# Patient Record
Sex: Female | Born: 1975
Health system: Southern US, Community
[De-identification: ages and names within clinical notes are randomized; demographics above are authoritative.]

## PROBLEM LIST (undated history)

## (undated) DIAGNOSIS — F419 Anxiety disorder, unspecified: Secondary | ICD-10-CM

## (undated) DIAGNOSIS — I951 Orthostatic hypotension: Secondary | ICD-10-CM

## (undated) DIAGNOSIS — Z91018 Allergy to other foods: Secondary | ICD-10-CM

## (undated) DIAGNOSIS — K859 Acute pancreatitis without necrosis or infection, unspecified: Secondary | ICD-10-CM

## (undated) DIAGNOSIS — Z8616 Personal history of COVID-19: Secondary | ICD-10-CM

## (undated) DIAGNOSIS — J189 Pneumonia, unspecified organism: Secondary | ICD-10-CM

## (undated) DIAGNOSIS — F32A Depression, unspecified: Secondary | ICD-10-CM

## (undated) DIAGNOSIS — G43109 Migraine with aura, not intractable, without status migrainosus: Secondary | ICD-10-CM

## (undated) DIAGNOSIS — Z87442 Personal history of urinary calculi: Secondary | ICD-10-CM

## (undated) DIAGNOSIS — L509 Urticaria, unspecified: Secondary | ICD-10-CM

## (undated) DIAGNOSIS — N711 Chronic inflammatory disease of uterus: Secondary | ICD-10-CM

## (undated) DIAGNOSIS — R55 Syncope and collapse: Secondary | ICD-10-CM

## (undated) DIAGNOSIS — M26609 Unspecified temporomandibular joint disorder, unspecified side: Secondary | ICD-10-CM

## (undated) HISTORY — PX: PARTIAL NEPHRECTOMY: SHX414

## (undated) HISTORY — PX: URETERAL STENT PLACEMENT: SHX822

## (undated) HISTORY — DX: Depression, unspecified: F32.A

## (undated) HISTORY — DX: Anxiety disorder, unspecified: F41.9

## (undated) HISTORY — DX: Migraine with aura, not intractable, without status migrainosus: G43.109

## (undated) HISTORY — DX: Chronic inflammatory disease of uterus: N71.1

## (undated) HISTORY — DX: Allergy to other foods: Z91.018

## (undated) HISTORY — DX: Urticaria, unspecified: L50.9

## (undated) HISTORY — DX: Unspecified temporomandibular joint disorder, unspecified side: M26.609

## (undated) HISTORY — PX: TUBAL LIGATION: SHX77

## (undated) HISTORY — PX: DILATION AND CURETTAGE OF UTERUS: SHX78

---

## 1997-12-13 ENCOUNTER — Other Ambulatory Visit: Admission: RE | Admit: 1997-12-13 | Discharge: 1997-12-13 | Payer: Self-pay | Admitting: Gynecology

## 1997-12-15 ENCOUNTER — Ambulatory Visit (HOSPITAL_COMMUNITY): Admission: RE | Admit: 1997-12-15 | Discharge: 1997-12-15 | Payer: Self-pay | Admitting: Gynecology

## 1998-04-05 ENCOUNTER — Other Ambulatory Visit: Admission: RE | Admit: 1998-04-05 | Discharge: 1998-04-05 | Payer: Self-pay | Admitting: Gynecology

## 1998-07-03 ENCOUNTER — Other Ambulatory Visit: Admission: RE | Admit: 1998-07-03 | Discharge: 1998-07-03 | Payer: Self-pay | Admitting: Gynecology

## 1998-09-26 ENCOUNTER — Other Ambulatory Visit: Admission: RE | Admit: 1998-09-26 | Discharge: 1998-09-26 | Payer: Self-pay | Admitting: Gynecology

## 1998-12-26 ENCOUNTER — Other Ambulatory Visit: Admission: RE | Admit: 1998-12-26 | Discharge: 1998-12-26 | Payer: Self-pay | Admitting: Gynecology

## 1999-08-14 ENCOUNTER — Other Ambulatory Visit: Admission: RE | Admit: 1999-08-14 | Discharge: 1999-08-14 | Payer: Self-pay | Admitting: Gynecology

## 1999-10-11 ENCOUNTER — Ambulatory Visit (HOSPITAL_COMMUNITY): Admission: RE | Admit: 1999-10-11 | Discharge: 1999-10-11 | Payer: Self-pay | Admitting: Gynecology

## 1999-10-11 ENCOUNTER — Encounter: Payer: Self-pay | Admitting: Gynecology

## 1999-10-25 ENCOUNTER — Encounter: Payer: Self-pay | Admitting: Gynecology

## 1999-10-25 ENCOUNTER — Ambulatory Visit (HOSPITAL_COMMUNITY): Admission: RE | Admit: 1999-10-25 | Discharge: 1999-10-25 | Payer: Self-pay | Admitting: Gynecology

## 2000-01-16 ENCOUNTER — Inpatient Hospital Stay (HOSPITAL_COMMUNITY): Admission: AD | Admit: 2000-01-16 | Discharge: 2000-01-18 | Payer: Self-pay | Admitting: Gynecology

## 2000-02-02 ENCOUNTER — Inpatient Hospital Stay (HOSPITAL_COMMUNITY): Admission: AD | Admit: 2000-02-02 | Discharge: 2000-02-02 | Payer: Self-pay | Admitting: Internal Medicine

## 2000-02-04 ENCOUNTER — Inpatient Hospital Stay (HOSPITAL_COMMUNITY): Admission: AD | Admit: 2000-02-04 | Discharge: 2000-02-07 | Payer: Self-pay | Admitting: Gynecology

## 2000-03-05 ENCOUNTER — Other Ambulatory Visit: Admission: RE | Admit: 2000-03-05 | Discharge: 2000-03-05 | Payer: Self-pay | Admitting: Internal Medicine

## 2000-03-10 ENCOUNTER — Encounter: Payer: Self-pay | Admitting: Gynecology

## 2000-03-10 ENCOUNTER — Ambulatory Visit (HOSPITAL_COMMUNITY): Admission: RE | Admit: 2000-03-10 | Discharge: 2000-03-10 | Payer: Self-pay | Admitting: Gynecology

## 2000-03-18 ENCOUNTER — Emergency Department (HOSPITAL_COMMUNITY): Admission: EM | Admit: 2000-03-18 | Discharge: 2000-03-18 | Payer: Self-pay | Admitting: Emergency Medicine

## 2000-04-06 ENCOUNTER — Encounter: Payer: Self-pay | Admitting: Urology

## 2000-04-06 ENCOUNTER — Ambulatory Visit (HOSPITAL_COMMUNITY): Admission: RE | Admit: 2000-04-06 | Discharge: 2000-04-06 | Payer: Self-pay | Admitting: Urology

## 2000-04-07 ENCOUNTER — Inpatient Hospital Stay (HOSPITAL_COMMUNITY): Admission: RE | Admit: 2000-04-07 | Discharge: 2000-04-10 | Payer: Self-pay | Admitting: Urology

## 2000-04-07 ENCOUNTER — Encounter: Payer: Self-pay | Admitting: Urology

## 2000-04-07 ENCOUNTER — Encounter (INDEPENDENT_AMBULATORY_CARE_PROVIDER_SITE_OTHER): Payer: Self-pay | Admitting: Specialist

## 2000-04-08 ENCOUNTER — Encounter: Payer: Self-pay | Admitting: Urology

## 2000-04-09 ENCOUNTER — Encounter: Payer: Self-pay | Admitting: Urology

## 2000-04-20 ENCOUNTER — Encounter: Admission: RE | Admit: 2000-04-20 | Discharge: 2000-04-20 | Payer: Self-pay | Admitting: Urology

## 2000-04-20 ENCOUNTER — Encounter: Payer: Self-pay | Admitting: Urology

## 2000-11-04 ENCOUNTER — Encounter: Admission: RE | Admit: 2000-11-04 | Discharge: 2000-11-04 | Payer: Self-pay | Admitting: Urology

## 2000-11-04 ENCOUNTER — Encounter: Payer: Self-pay | Admitting: Urology

## 2001-02-18 ENCOUNTER — Encounter: Payer: Self-pay | Admitting: Urology

## 2001-02-18 ENCOUNTER — Encounter: Admission: RE | Admit: 2001-02-18 | Discharge: 2001-02-18 | Payer: Self-pay | Admitting: Urology

## 2001-03-10 ENCOUNTER — Other Ambulatory Visit: Admission: RE | Admit: 2001-03-10 | Discharge: 2001-03-10 | Payer: Self-pay | Admitting: Gynecology

## 2001-11-04 ENCOUNTER — Other Ambulatory Visit: Admission: RE | Admit: 2001-11-04 | Discharge: 2001-11-04 | Payer: Self-pay | Admitting: Gynecology

## 2001-12-02 ENCOUNTER — Ambulatory Visit (HOSPITAL_COMMUNITY): Admission: RE | Admit: 2001-12-02 | Discharge: 2001-12-02 | Payer: Self-pay | Admitting: Gynecology

## 2001-12-02 ENCOUNTER — Encounter: Payer: Self-pay | Admitting: Gynecology

## 2002-04-18 ENCOUNTER — Inpatient Hospital Stay (HOSPITAL_COMMUNITY): Admission: AD | Admit: 2002-04-18 | Discharge: 2002-04-18 | Payer: Self-pay | Admitting: Gynecology

## 2002-04-21 ENCOUNTER — Observation Stay (HOSPITAL_COMMUNITY): Admission: AD | Admit: 2002-04-21 | Discharge: 2002-04-22 | Payer: Self-pay | Admitting: Gynecology

## 2002-05-03 ENCOUNTER — Inpatient Hospital Stay (HOSPITAL_COMMUNITY): Admission: AD | Admit: 2002-05-03 | Discharge: 2002-05-07 | Payer: Self-pay | Admitting: Gynecology

## 2002-05-03 ENCOUNTER — Encounter (INDEPENDENT_AMBULATORY_CARE_PROVIDER_SITE_OTHER): Payer: Self-pay | Admitting: *Deleted

## 2002-05-08 ENCOUNTER — Inpatient Hospital Stay (HOSPITAL_COMMUNITY): Admission: AD | Admit: 2002-05-08 | Discharge: 2002-05-08 | Payer: Self-pay | Admitting: Gynecology

## 2002-06-02 ENCOUNTER — Encounter: Admission: RE | Admit: 2002-06-02 | Discharge: 2002-06-02 | Payer: Self-pay | Admitting: Urology

## 2002-06-02 ENCOUNTER — Encounter: Payer: Self-pay | Admitting: Urology

## 2002-06-27 ENCOUNTER — Other Ambulatory Visit: Admission: RE | Admit: 2002-06-27 | Discharge: 2002-06-27 | Payer: Self-pay | Admitting: Gynecology

## 2003-01-24 ENCOUNTER — Encounter: Payer: Self-pay | Admitting: Emergency Medicine

## 2003-01-24 ENCOUNTER — Emergency Department (HOSPITAL_COMMUNITY): Admission: EM | Admit: 2003-01-24 | Discharge: 2003-01-24 | Payer: Self-pay | Admitting: Emergency Medicine

## 2005-09-08 ENCOUNTER — Ambulatory Visit: Payer: Self-pay | Admitting: Internal Medicine

## 2005-10-03 ENCOUNTER — Ambulatory Visit: Payer: Self-pay | Admitting: Internal Medicine

## 2007-12-07 ENCOUNTER — Ambulatory Visit (HOSPITAL_COMMUNITY): Admission: RE | Admit: 2007-12-07 | Discharge: 2007-12-07 | Payer: Self-pay | Admitting: Cardiovascular Disease

## 2008-02-17 ENCOUNTER — Emergency Department (HOSPITAL_COMMUNITY): Admission: EM | Admit: 2008-02-17 | Discharge: 2008-02-17 | Payer: Self-pay | Admitting: Emergency Medicine

## 2011-02-11 NOTE — Op Note (Signed)
[name], [name]             ACCOUNT NO.:  [id number]   MEDICAL RECORD NO.:  [id number]          PATIENT TYPE:  OIB   LOCATION:  2854                         FACILITY:  [facility]   PHYSICIAN:  Richard A. Alanda Amass, M.D.DATE OF BIRTH:  10/20/1975   DATE OF PROCEDURE:  12/07/2007  DATE OF DISCHARGE:                               OPERATIVE REPORT   TILT TABLE REPORT:  [name] is a 35 year old divorced white mother of three children, 14-  year-old girl and a 68 and 34 year old boys.  She is a nonsmoker and  works as a Lawyer at [employer].  She has a long history of  approximately 10 years of recurrent presyncope historically compatible  with neurocardiogenic syncope and autonomic dysfunction.  She has had a  negative endocrinology workup in the past.  She had been under the care  of Dr. Armanda Magic and subsequently under the continued care of Dr. Yates Decamp for this.  She had been on intermittent Proamatine and Florinef in  the past.  She has recurrent episodes of near-syncope and presyncope and  has had some very transient episodes of lost consciousness for  seconds.  She occasionally has palpitations but these are short-lived  and are usually not associated with her presyncopal episodes.  Her  episodes can happen at any time and are not specifically brought on by  stress.  She has not been tried on beta blockers or SSRIs in the past  and has self-regulated her midodrine historically.  Currently she is on  moderate dose, 5 mg t.i.d., and Florinef 0.1 mg at bedtime was fairly  good control of her symptoms.  She has had monitors in the past and  developed short, nonsustained runs of the SVT.  She had been seen by Dr.  Lewayne Bunting in the past, who noted that she had and some ectopic atrial  beats and some wandering atrial pacemaker.  He also felt that her rhythm  showed possible long RP atrial SVT that was nonsustained.  He and the  patient both felt that since these episodes  were infrequent, that she  was currently not a candidate for ablation.  Once again, she has not  been tried on calcium and beta blockers because she generally has a  relatively low blood pressure in the 95-100 range.  She was referred by  Dr. Jacinto Halim for tilt table test to rule out any significant  bradyarrhythmia.   The patient was brought to the second floor CP lab, placed supine on the  tilt table and stabilized prior to tilting.  She had been off her  midodrine and Proamatine for 48 hours.  She was normally hydrated IV-  wise lines pre test.   She was tilted upright at 70 degrees.  Throughout the 45-minute period  of tilt, blood pressure ranged from 105-110.  The lowest was  approximately 98 and there was minimal fluctuation.  For the first 35-40  minutes, heart rate range came up from 59 to the 90 range and then to  about 100.  She had a short episode of relative sinus tachycardia  lasting  for about 5 seconds that was asymptomatic at tilt of about 40  minutes, which may have represented a nonsustained run of her SVT.  She  then dropped down to 56-60 per minute.  This was asymptomatic and then  heart rate came back to the 80s.   She was tilted for a total of 45 minutes with no syncope, presyncope or  lightheadedness and no sustained arrhythmia and no significant  bradycardia.   In the post-tilt period she had some occasional sinus pauses of about 1  to 1-1/2 seconds that were asymptomatic.  There was no significant  bradyarrhythmia and no hypotension and no SVT.   The patient was not given Isuprel infusion because of her history of  atrial tachycardia in the past and the fact that she did reach heart  rates of 110-120 with normal tilting   Tilt table test essentially negative.  No significant symptoms.  No  significant bradycardia.  There was a short nonsustained run of probable  atrial tachycardia that was asymptomatic toward the end of the 45-minute  upright tilt and  occasional sinus pauses in the first 5 minutes of  recovery that was short, that there were 1 to 1-1/2 seconds and  asymptomatic.   The patient was reassured about these results.  She will be observed in  the outpatient area and we plan to discharge her later to follow up with  Dr. Jacinto Halim.      Richard A. Alanda Amass, M.D.  Electronically Signed     RAW/MEDQ  D:  12/07/2007  T:  12/08/2007  Job:  [id number]   cc:   [facility]

## 2011-02-14 NOTE — H&P (Signed)
[facility]  Patient:    [name], [name]                      MRN: [id number] Adm. Date:  [id number] Disc. Date: [id number] Attending:  Tonye Royalty                         History and Physical  CHIEF COMPLAINT:              Term intrauterine pregnancy.  Favorable cervix at 38+ weeks estimated gestational age.  Left upper quadrant anechoic cystic mass.  HISTORY OF PRESENT ILLNESS:   The patient is a 35 year old, gravida 3, para 1, abortus 1, whose corrected estimated date of confinement is May 22, and at the ime of admission for induction will be [redacted] weeks pregnant.  The patients prenatal course has been significant for the fact that:  1) Her dates were not consistent with her size and an ultrasound had readjusted her due date to May 22.  The patient during one of her visits at 16 weeks had pointed out that she was having "blackouts" two to three weeks prior to that and on cardiac auscultation, she was found to have a systolic ejection murmur grade 2 to 3/6 with a loud S2 sound, possible mitral valve prolapse and she was referred to Armanda Magic, M.D., cardiologist here in [place].  She had an echocardiogram which demonstrated hat she had normal left ventricular internal dimensions with low normal left ventricular function, moderate pulmonic regurgitation, mild tricuspid regurgitation, no pericardial effusion was reported.  No evidence of ASD or VSD. In comparison to a previous study, her left ventricular function appeared slightly better was the report.  Armanda Magic, M.D. had stated that her heart murmur had no significant valvular lesion and that dizziness was more than likely secondary to low blood pressure from pregnancy and mildly reduced left ventricular function nd the final recommendation was to follow up late in June after her delivery for a  repeat ultrasound of her heart to make sure that her left ventricular  function as not changed any.  No recommendations at the time of delivery or during labor was reported.  On January 8, the patient had presented to the office complaining of  right flank pain, abdominal discomfort which started during the evening as a stabbing sensation.  She had extensive workup to include an ultrasound of her gallbladder, kidney, and upper abdomen.  The ultrasound had shown no evidence of abruptio placenta, normal gallbladder, liver, and abdomen with the exception in  confirming an anechoic mass in the left upper quadrant measuring 8.2 cm.  The differential diagnosis was possible renal cyst or an obstructed duplicated system or posttraumatic hematoma with resolution to the cystic area.  Subsequently, the patient had an MRI done on January 26, which demonstrated once again an 8 cm cyst interposed between the spleen and the kidney on the left with displacement of the left kidney inferiorly and some displacement of the pancreatic tail superiorly nd anteriorly.  The report had also stated some of the images that it was possible  that this may be projecting from the upper pole region of the kidney, although,  leaking of the renal cortex was not identified.  A renal cyst was entertained and a possible seroma also.  They did not see a separate adrenal gland, but because of the cyst, there was distortion of the anatomy.  Nevertheless, there  was no mesenteric or retroperitoneal adenopathy or hydronephrosis of either kidney. Follow-up CT scan was recommended with contrast after delivery as the recommendation by Jamison Neighbor, M.D. for which a telephone consultation had een made during this evaluation.  The patient was continued to be followed through er prenatal visits with complaints of low abdominal pains and pelvic pressure. [facility] for evaluation and treatment of low back discomfort and stretching exercises to help  alleviate her symptoms until the time of her delivery.  She was hospitalized during her third trimester for several days and was found to be in preterm labor and was released home on Terbutaline oral tocolytics 2.5 mg p.o. q.4h.  Her GBS culture at the hospital n April 19, was negative.  Her Terbutaline was discontinued at 37 weeks and she was seen in the office this on May 7, and was found to be fingertip, 70 to 80% effaced, -3 station, and once again was complaining of lower abdominal pressure and had een seen in the hospital two days before where she thought she was in labor.  PAST MEDICAL HISTORY:         The patient is an unwed parent.  She had a spontaneous miscarriage in 1997.  In 1998 she had a term pregnancy at [redacted] weeks gestation that was 7 pounds 11 ounce female with no complications.  ALLERGIES:                    No known drug allergies.  FAMILY HISTORY:               Otherwise unremarkable.  The father of this child is unknown.  REVIEW OF SYSTEMS:            See [place] form.  PHYSICAL EXAMINATION:  GENERAL:                      A well-developed, well-nourished female.  HEENT:                        Unremarkable.  NECK:                         Supple.  Trachea midline.  No carotid bruits.  No  thyromegaly.  LUNGS:                        Clear to auscultation without rhonchis or wheezes.  HEART:                        Regular rate and rhythm with no murmurs or gallops.  BREASTS:                      Done during the first trimester and was reported o be normal.  ABDOMEN:                      Gravid uterus with a fundal height, measurement of 38 cm.  Vertex presentation by [facility] maneuver.  Positive fetal heart tones.  PELVIC:                       Cervix fingertip, 70 to 80% effaced, -3 station.  EXTREMITIES:  DTR 1+, negative clonus.  Trace edema.  PRENATAL LABORATORY DATA:     O negative blood type, negative antibody  screen.  Toxoplasmosis screen was negative. VDRL was negative.  Rubella titer with evidence of immunity.  Hepatitis B surface antigen and HIV were negative.  Toxicology screen was negative.  Pap smear was normal.  Alpha fetoprotein was normal and so was the  diabetes screen.  GBS culture done on April 19, was negative.  The patient did receive her RhoGAM at [redacted] weeks gestation on February 2.  ASSESSMENT:                   A 35 year old, gravida 3, para 1, abortus 1, at [redacted] weeks gestation with persistent complaints of lower abdominal pressure.  She has been in the hospital several times in early labor.  She has had a history of preterm labor with this pregnancy.  The patient has a left upper quadrant anechoic cystic mass measuring 8 cm, possible renal cyst which will need to be followed p with a CT with contrast after delivery.  During her postpartum period, perhaps ix weeks later.  The patient with negative Group B Strep culture on April 19 in the office.  The patient is being brought in due to the fact that her cervix appears to be favorable.  She is now 38 weeks and because of all of the discomfort and pain that she has had, we are admitting her for an induction.  Risks, benefits, pros, and cons of induction were discussed with the patient and her mother.  All questions were answered and will follow accordingly.  PLAN:                         The patient is to be admitted on the evening of May 8, for Cervidil placement for cervical ripening followed by Pitocin induction in the early morning of May 9.  All questions were answered and will follow accordingly. DD:  02/04/00 TD:  02/04/00 Job: [id number]

## 2011-02-14 NOTE — Discharge Summary (Signed)
NAME:  [name], [name]                         ACCOUNT NO.:  [id number]   MEDICAL RECORD NO.:  [id number]                   PATIENT TYPE:  MAT   LOCATION:  [facility]                                 FACILITY:  [facility]   PHYSICIAN:  Timothy P. Fontaine, M.D.           DATE OF BIRTH:  1976/08/17   DATE OF ADMISSION:  05/03/2002  DATE OF DISCHARGE:  05/07/2002                                 DISCHARGE SUMMARY   DISCHARGE DIAGNOSES:  Intrauterine pregnancy at 37+ weeks delivered,  postural hypotension, request for elective permanent sterilization,  prolapsed cord, status post emergency low uterine transverse cesarean  section, bilateral tubal sterilization procedure Pomeroy technique by Dr.  Reynaldo Minium on May 04, 2002, Rh negative.   HISTORY:  This is a 35 year old female gravida 4, para 2, aborta 1 with an  EDC of May 19, 2002.  The patient's prenatal course was complicated by  patient having postural hypotension and was placed on Florinef which she was  placed on 0.1 mg q.d.  Also had history of recurrent urinary tract  infections.  Had been on Macrobid q.d. and history of left upper pole renal  nephrectomy in 2001.  She also developed preterm labor.  Was on terbutaline  until 36 weeks this pregnancy.  Recommended by the cardiologist to place  patient on Solu-Medrol 30 mg IV q.12h. when she was in labor as well as  postpartum and eventually taper off with Deltasone patch one week upon  discharge.   HOSPITAL COURSE:  On May 03, 2002 patient is admitted at 37-1/2 weeks for  induction secondary to anxiety, apprehension, and history of postural  hypotension.  The patient did desire elective permanent sterilization.  The  patient was begun on Pitocin and was begun on Solu-Medrol 30 mg IV q.12h.  During labor.  On the morning of May 04, 2002 artificial rupture of  membranes was performed and prolapsed cord was evident.  The examiner was  able to place a cord back into the uterine  cavity.  The patient was placed  in Trendelenburg position and was rushed for emergency cesarean section.  On  May 04, 2002 patient underwent an emergency low uterine segment transverse  cesarean section, bilateral tubal sterilization procedure Pomeroy technique  by Dr. Reynaldo Minium and at [phone] underwent delivery of a female, Apgars of 8  and 10 weighing 7 pounds 4 ounces.  Postpartum patient remained afebrile,  voiding, and in stable condition.  She was discharged to home on May 07, 2002.  She had received RhoGAM prior to discharge.   ACCESSORY CLINICAL FINDINGS:  Laboratories:  The patient is O-.  Rubella  immune.  On May 05, 2002 hemoglobin 9.3, hematocrit 26.4.   DISPOSITION:  The patient was discharged to home.  Follow up in six weeks.  Have any problems prior to that time to be seen in the office.  Steroid  taper was given and she was  to call her cardiologist to follow up the  following Monday to see if they wanted to change her dose of the steroid  pack or continue to taper.  She was given a prescription for __________  20  p.r.n. pain.     Susa Loffler, P.A.                    Timothy P. Fontaine, M.D.    TSG/MEDQ  D:  06/03/2002  T:  06/03/2002  Job:  [id number]

## 2011-02-14 NOTE — Discharge Summary (Signed)
[facility]  Patient:    [name], [name]                      MRN: [id number] Adm. Date:  [id number] Disc. Date: 01/17/00 Attending:  Douglass Rivers Dictator:   Antony Contras, RNC, [facility]                           Discharge Summary  DISCHARGE DIAGNOSES:          1. Intrauterine pregnancy at 35-2/7 weeks.                               2. Preterm labor.  PROCEDURE:                    Tocolysis, magnesium sulfate drip.  HISTORY OF PRESENT ILLNESS:   The patient is a 35 year old, gravida 3, para 1, abortus 1, with a corrected EDC of Feb 23, 2000.  Prenatal course was complicated by the following risk factors.  A slight reduction of left ventricular function per echogram done secondary to a fainting spell at 16.5 weeks.  Right lower quadrant pain since January of 2001.  Incidental finding anechoic mass left upper quadrant 8.2 cm between the kidney and spleen, questionable renal cyst.  PRENATAL LABORATORY DATA:     Blood type O negative, antibody screen negative, rubella positive.  RPR, HBSAG, HIV negative.  MSAFP normal.  HOSPITAL COURSE:              The patient was admitted on January 16, 2000, with uterine contractions.  Cervix at that time was 1 cm, thick, vertex presentation. She was initially managed with Terbutaline 0.25 mg subcu q.20 minutes apart x 3  doses.  Her contractions continued and she did progress in dilatation to 1 cm, 0 to 100% effaced, -2 station.  Magnesium sulfate drip was initiated.  She responded to this management and the magnesium sulfate was able to be discontinued on January 17, 2000, and oral Terbutaline begun.  She continued to respond to this treatment with no further contractions or cervical changes and was able to be discharged n April 21, in stable condition.  DISPOSITION:                  Follow up in the office as indicated.  Continue on oral Terbutaline.  Notify office with any preterm labor  symptoms. DD:  02/07/00 TD:  02/07/00 Job: [id number]

## 2011-02-14 NOTE — Op Note (Signed)
[facility]  Patient:    [name], [name]                      MRN: [id number] Proc. Date: 04/07/00 Adm. Date:  [id number] Disc. Date: [id number] Attending:  Doug Sou CC:         Jamison Neighbor, M.D.             Juan H. Lily Peer, M.D.                           Operative Report  SERVICE:  Urology.  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  PROCEDURE:  Cystoscopy, left retrograde ureteral pyelography with ureteral catheterization, left partial nephrectomy.  SURGEON:  Jamison Neighbor, M.D.  ASSISTANT:  Lindaann Slough, M.D.  ANESTHESIA:  General.  COMPLICATIONS:  None.  DRAINS:  16 French Foley catheter.  BRIEF HISTORY:  This 35 year old female was found to have a cystic structure in the upper pole of the left kidney during a recent pregnancy. This was first seen on ultrasound and then was confirmed with CT. An ECG showed no evidence of reflux into the lower pole segment. It does appear to be complete duplication but the ureteral anatomy has not been well defined on studies. The patient has pain on the left hand side and would like to have this mass removed. She understands the risks and benefits of the procedure and gave full and informed consent. We noted that the patient had a normal urine in the office yesterday. She has been started on the antibiotics and will have antibiotics given preoperatively Ancef and gentamycin).  DESCRIPTION OF PROCEDURE:  After successful induction of general anesthesia, the patient was placed in the dorsal lithotomy position, prepped with Betadine and draped in the usual sterile fashion. Cystoscopy was performed and the bladder was carefully inspected. The mucosa was somewhat friable and reddened most likely from her cystitis. The left ureteral orifice was found out somewhat  laterally in a retrograde study done through the side showed a drooping lily sign with the lower pole segment pushed down from the mass up above the kidney. A ureteral calculus passed up into this area in case access was needed to the collecting system. It was thought that perhaps an incision would need to be made across the kidney to remove the upper pole segment and if so, it would be ideal to be able to place methylene blue up into the collecting system to make sure that it remained intact. The triangle was carefully searched and the ureteral orifice to the upper pole segment was never seen. The patient had a Foley catheter inserted. The ureteral catheter was attached to that with silk ties and they were placed in separate drainage bags. The patient was then from the dorsal lithotomy position, placed in a full flank position with a beanbag in place, the kidney rest raised and the table fully flexed. She had PAS in place. She was fully padded including the axillary roll. She was then secured with tape, prepped with Betadine and draped in the usual sterile fashion. The twelfth and the eleventh ribs were easily seen and an incision was made taking off the tip of the twelfth rib. This was carried down through the flank exposing the kidney. The pleura was seen but was not entered. Gerotas fascia  was opened and the kidney was seen. As the kidney was flipped forward, the right renal artery could be identified. The cyst was dissected away from the surrounding tissue and appeared to be very separate from the kidney itself. There was no parenchyma surrounding this upper pole segment. It was seen to have indented the remainder of the kidney. The cyst involved the adrenal gland and to remove the cyst the adrenal gland had to be taken. Clips were used to take the adrenal gland and cyst down with all connections being clipped to prevent bleeding. Some of these were quite close in the renal artery where  the mass was pushed up against it but the renal artery was not injured in any way. The mass was removed including the adrenal gland. Hemostasis was obtained with additional clips and electrocautery. Careful inspection showed that the kidney was otherwise normal but had been pushed down to an inferior position. The blood supply to the kidney appeared normal. The kidney was quite viable. The intra-abdominal contents were not injured in any way. Careful inspection of the pleura showed 1 possible pin hole which was oversewn. The patient will have a chest x-ray checked to make sure she has not developed any pneumothorax of any kind. The patient was taken out of flex. The entire area was irrigated with antibiotic solution. She was then closed in layers with #1 Vicryl and the final layer being a #1 PDS. The skin was closed with surgical clips after irrigation with antibiotic solution. The patient had a dressing applied. The ureteral catheter was removed. The Foley catheter was placed for straight drainage. The patient tolerated the procedure well and was taken to the recovery room in good condition. DD:  04/07/00 TD:  04/07/00 Job: [id number]

## 2011-02-14 NOTE — H&P (Signed)
NAME:  [name]                         ACCOUNT NO.:  [id number]   MEDICAL RECORD NO.:  [id number]                   PATIENT TYPE:  INP   LOCATION:  9175                                 FACILITY:  [facility]   PHYSICIAN:  Juan H. Lily Peer, M.D.             DATE OF BIRTH:  12/15/1975   DATE OF ADMISSION:  05/03/2002  DATE OF DISCHARGE:                                HISTORY & PHYSICAL   HISTORY OF PRESENT ILLNESS:  The patient is a 35 year old gravida 4, para 2,  AB1 currently at 37-1/2 weeks estimated gestational age with an estimated  date of confinement of May 19, 2002.  The patient was admitted to [facility] on July 25 for observation.  She was in early labor and was  released home.  Her cervix at that time had demonstrated to be fingertip in  dilatation, about 60%-70% effacement, and ballotable vertex presentation.  She has had much anxiety and apprehension with this pregnancy.  She has  suffered from postural hypertension and has had extensive evaluation by a  cardiologist.  No structural cardiac defect noted per se and was eventually  placed on Florinef, for which she was taking 0.1 mg p.o. q.d.  She has had a  history of the syncope with a borderline hypotension and this was the  recommendation for her to have been on the Florinef 0.1 mg q.d.  She also  has had recurrent urinary tract infections and for prophylaxis she had been  on Macrobid one p.o. q.d.  She has had a previous history of left upper pole  renal nephrectomy in 2001.  She had been on terbutaline up to approximately  [redacted] weeks gestation due to the premature contractions.  This is the reason  that she was admitted for induction due to her anxiety and apprehension and  due to the fact that her previous pregnancies were five and eight hours,  respectively.  Recommendation by the cardiologist was to place the patient  on Solu-Medrol 30 mg IV q.12h. when in labor, as well as postpartum, and to  eventually  taper off with a Deltasone pack over one week upon discharge from  the hospital.   PAST MEDICAL HISTORY:  She has had a left upper pole nephrectomy in 2001.  Preterm labor last pregnancy and this pregnancy.  She received RhoGAM at [redacted]  weeks gestation.  History of syncope and borderline hypotension and  recurrent urinary tract infections.  D&C in the past.   ALLERGIES:  She denies any medical allergy.   REVIEW OF SYSTEMS:  See [place] form.   PHYSICAL EXAMINATION:  GENERAL:  A well-developed, well-nourished female.  HEENT:  Unremarkable.  NECK:  Supple, trachea midline.  No carotid bruits, no thyromegaly.  LUNGS:  Clear to auscultation.  No rhonchi or wheezes.  HEART:  Regular rate and rhythm.  No murmurs or gallops.  BREASTS:  Exam not done.  ABDOMEN:  Soft, nontender without rebound or guarding.  PELVIC:  Fingertip, 60%-70% effaced, ballotable.  EXTREMITIES:  DTR 1+, negative clonus.  Trace edema.   PRENATAL LABORATORY DATA:  Blood type is O- and negative antibody screen.  VDRL, hepatitis B surface antigen, HIV were negative.  Rubella titer with  evidence of immunity.  Pap smear was normal.  Alpha-fetoprotein was normal.  Diabetes screen was normal.  GBS culture was negative.   ASSESSMENT:  A 34 year old gravida 4, para 2, AB1 at 37-1/2 weeks estimated  gestational age with anxiety and apprehension during this pregnancy.  She  has had episodes of false labor.  The patient with history of postural  hypotension and has been on Florinef 0.1 mg q.d. and was admitted for  induction.  Her cervix was fingertip, 60%-70% effaced, -2 station,  ballotable presentation.  A reassuring fetal heart rate tracing on  admission.  She will be started on Pitocin and possibly, if the cervix does  not change after 8 or 10 hour period, we will need to discontinue the  Pitocin and start Cervidil for cervical ripening, if the fetal heart rate  tracing and vital signs continue to be reassuring.  The  patient with  negative group B Strep culture.  Game plan also is to start Solu-Medrol 30  mg IV q.12h. during labor and postpartum and taper off with a Deltasone pack  over one week upon discharge from the hospital.  All of the above were  discussed with the patient.  All questions were answered and we will follow  accordingly.   PLAN:  As per assessment above.                                               Juan H. Lily Peer, M.D.    JHF/MEDQ  D:  05/03/2002  T:  05/03/2002  Job:  [id number]

## 2011-02-14 NOTE — Discharge Summary (Signed)
[facility]  Patient:    [name], [name]                      MRN: [id number] Adm. Date:  [id number] Disc. Date: [id number] Attending:  Londell Moh CC:         Jamison Neighbor, M.D.             Juan H. Lily Peer, M.D.                           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  DISCHARGE DIAGNOSES: 1. 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 2. Postoperative pneumothorax.  PRINCIPAL PROCEDURE:  Cystoscopy left retrograde and partial nephrectomy done on the date of admission.  HISTORY:  This 35 year old female underwent evaluation for a possible infection during her pregnancy.  Ultrasound showed a cystic mass in the upper pole felt to be consistent with duplication and degeneration of the upper pole segment.  CT scan confirmed the presence of this mass.  ECG showed no evidence of reflux.  The patient was quite bothered by this large mass and would like to have it removed.  She is being admitted following her partial nephrectomy.  PAST MEDICAL HISTORY:  Unremarkable.  She does have a history of heart murmur. A recent 2-D echocardiogram was done which showed there were no abnormalities noted.  The patient is still breast feeding.  MEDICATIONS:  She takes no medications other than oral contraceptives and prenatal vitamin.  HOSPITAL COURSE:  The patient was taken to the operating room where she underwent cystoscopy left retrograde and partial nephrectomy.  Her postoperative course was unremarkable.  She was noted in the recovery room to have a small pneumothorax.  This was carefully monitored.  It was completely asymptomatic and will be followed after discharge.  The patient rapidly advanced to a regular diet. She was ready for discharge by April 10, 2000. Her temperature had returned to normal.  She was eating a regular diet.  She had no problems with her incision and no  symptoms from her minimal pneumothorax.  DISCHARGE MEDICATIONS:  The patient was sent home with Tylox and Keflex.  FOLLOWUP:  She will return to the office in one weeks time for staple removal and a repeat chest x-ray. DD:  04/21/00 TD:  04/24/00 Job: [id number]

## 2011-02-14 NOTE — H&P (Signed)
[facility]  Patient:    [name], [name]                      MRN: [id number] Adm. Date:  [id number] Attending:  Londell Moh CC:         Jamison Neighbor, M.D.             Armanda Magic, M.D.                         History and Physical  ADMITTING DIAGNOSIS:  Left upper pole duplication with cystic degeneration.  HISTORY:  This 35 year old female underwent evaluation for possible infections during her recent pregnancy.  An ultrasound showed a cystic mass in the upper pole of the left kidney.  This was felt to be consistent with a duplication.  The patient had a CT scan, which confirmed the presence of the mass.  A VCG showed o evidence of any reflux.  The patient is bothered by this large mass and would like to have it removed.  She understands the risks and benefits of the procedure and gave full and informed consent.  The patient will be admitted following her partial nephrectomy.  PAST MEDICAL HISTORY:  Pertinent for a history of heart murmur.  She had a recent 2D echo which showed that this was asymptomatic.  The patient had a child two months ago and is still breast-feeding.  She will use a breast pump during her hospital stay.  The patient has a history of asymptomatic murmur.  She does take antibiotics prior to surgery.  She has seen Dr. Mayford Knife, her cardiologist, and echocardiogram was unremarkable.  She is scheduled for follow-up in August. The patient was anemic prior to her pregnancy and had some problems with low blood pressure during her pregnancy.  The patient has no other chronic medical problems.  MEDICATIONS:  The patients only current medications are oral contraceptives and  prenatal vitamins.  FAMILY HISTORY:  Noncontributory.  SOCIAL HISTORY:  Noncontributory.  She does live here in [place], is married. She does not use tobacco or alcohol.  She is breast-feeding her small child.  REVIEW OF SYSTEMS:   Noncontributory.  PHYSICAL EXAMINATION:  GENERAL:  She is a well-developed, well-nourished female in no acute distress.   HEENT:  Normocephalic and atraumatic.  Cranial nerves II-XII are grossly intact.  NECK:  Supple.  No adenopathy, thyromegaly, or bruits.  HEART:  Regular rate and rhythm.  She does have an asymptomatic heart murmur as  described above.  LUNGS:  Clear.  ABDOMEN:  Soft, nontender, with no palpable masses, rebound, or guarding.  Some  striae are noted from her recent pregnancy.  PELVIC:  The patient did not undergo pelvic examination prior to her surgical procedure today.  EXTREMITIES:  No clubbing, cyanosis, or edema.  IMPRESSION:  Dysfunction upper pole kidney with cystic degeneration.  RECOMMENDATION:  Admit following cystoscopy, retrograde studies, and partial nephrectomy. DD:  04/07/00 TD:  04/07/00 Job: [id number]

## 2011-02-14 NOTE — Consult Note (Signed)
NAME:  [name], [name]                         ACCOUNT NO.:  [id number]   MEDICAL RECORD NO.:  [id number]                   PATIENT TYPE:  MAT   LOCATION:  MATC                                 [facility]ACILITY:  [facility]   PHYSICIAN:  Timothy P. Fontaine, M.D.           DATE OF BIRTH:  08-22-1976   DATE OF CONSULTATION:  DATE OF DISCHARGE:                                OB/GYN CONSULTATION   CHIEF COMPLAINT:  Draining incision.   HISTORY OF PRESENT ILLNESS:  The patient is a 35 year old status post  cesarean section the week prior to evaluation went home yesterday  postoperatively doing well, having her staples removed and Steri-Strips  placed.  The patient noted this afternoon that she had a coughing fit where  she coughed violently several times, and she noticed a spurt of fluid from  the middle of her incision with a fairly large gush.  She presents for  evaluation.  The patient did bring in a towel that did have a fair amount of  serous fluid on it.  Of note, the patient is on steroids for a positional  hypotension, and is currently taking a steroid withdrawal dose.   PHYSICAL EXAMINATION:  VITAL SIGNS:  On evaluation, the patient is afebrile.  ABDOMEN:  Her abdomen is soft, nontender, without masses, guarding, rebound,  or organomegaly.  Her incision shows Steri-Strips in place, and there is a small defect at the  mid incisional line, and the Steri-Strips were removed away from this.  The  incision, otherwise, appears intact with the Steri-Strips.  There is no  erythema or evidence of infection.  On vigorous palpation, her underlying  fascial line appears intact, and there is no extruding fluid from the small  defect.   I have reviewed with the patient the options to remove the Steri-Strips and  open the incision for further exploration and wound care, versus monitoring  it at present and seeing if this was not an isolated seroma which came to a  head drain, and now hopefully the  incision will heal without having to open  it in its entirety.  The patient would prefer an attempt of conservative  management, and she will monitor this tonight, and I have asked her to  follow up in the office tomorrow or the following day.  If the drainage  subsides, then we will follow it expectantly, if it continues or  reaccumulation occurs with a subsequent burst of seroma type fluid, then we  will ultimately have to open her incision and address this with packing and  closure by secondary intention.  The patient agrees with the plan.                                               Timothy P. Fontaine, M.D.  TPF/MEDQ  D:  05/08/2002  T:  05/09/2002  Job:  [id number]

## 2011-02-14 NOTE — Discharge Summary (Signed)
[facility]  Patient:    [name], [name] Visit Number: [id number] MRN: [id number]          Service Type: OBS Location: 910B 9162 01 Attending Physician:  Tonye Royalty Dictated by:   Gaetano Hawthorne. Lily Peer, M.D. Admit Date:  04/21/2002                             Discharge Summary  OBSERVATION STAY  HISTORY:                      The patient is a 35 year old gravida 4, para 2, AB 1, currently at [redacted] weeks gestation.  The patient with a past history of syncope and borderline hypotension, has been on Florinef 0.1 mg p.o. q.d. and also for recurrent UTI prophylaxis she has been on Macrobid one p.o. q.d. due to the fact that she has had a left upper pole nephrectomy in 2001.  The patient was on terbutaline as of 24 hours prior to being admitted to the hospital for observation.  She presented to [facility] on the late evening of July 24, complaining of contractions every 4-5 minutes apart.  She was placed on the monitor and was contracting that frequent.  A reactive fetal heart rate tracing and her cervix according to the triage nurse was 2 cm, 80% effaced.  Due to her history of the postural hypotension and following the recommendations of the cardiologist which had been to start the patient on Solu-Medrol 30 mg IV q.12h. during labor and postpartum, she was started on that and eventually received one single dose.  Her GBS status was negative. She was monitored throughout the evening, was given 1 mg of Stadol with 12.5 of Phenergan one single dose was administered and she slept throughout the night.  Her contractions eventually had decreased in frequency and intensity.  PHYSICAL EXAMINATION:  VITAL SIGNS ON ADMISSION:     Blood pressure 103/82, respiration was 20, temperature 97.1, pulse 83.  PELVIC:                       Her pelvic exam this morning actually had demonstrated the cervix to be fingertip and about 70%, ballottable  vertex. Fetal heart rate tracing was reassuring and contractions were now every 8-10 minutes apart and very very mild.  LABORATORY DATA:              She had admission labs as follows: WBC was 10.3, hemoglobin and hematocrit were 13.5 and 37.8, respectively, and platelet count of 225,000.  ASSESSMENT AND PLAN:          Based on these findings of her being early in false labor and cervix essentially nonfavorable, she was discharged home with instructions.  She will continue to take the Florinef as previously prescribed, but will hold off until tomorrow when she will begin again taking the previously prescribed dose of 0.1 mg q.d. and along with her Macrobid and her prenatal vitamins.  She has a scheduled appointment in the office next week.  Labor instructions were provided that if her contractions were to increase in intensity and frequency, she will report back to the hospital or to the office for monitoring and pelvic exam as well.  All of this was explained to the patient and husband, all questions were answered and will follow accordingly. Dictated by:   Gaetano Hawthorne Lily Peer, M.D. Attending Physician:  Tonye Royalty DD:  04/22/02 TD:  04/22/02 Job: [id number]

## 2011-02-14 NOTE — Discharge Summary (Signed)
[facility]  Patient:    [name], [name]                      MRN: [id number] Adm. Date:  [id number] Disc. Date: [id number] Attending:  Douglass Rivers Dictator:   Antony Contras, RNC, [place], N.P.                           Discharge Summary  DISCHARGE DIAGNOSES:          1. Intrauterine pregnancy at 38 weeks.                               2. History of left upper quadrant anechoic mass.                               3. History of lower abdominal pain, pelvic pressure.                               4. History of preterm labor managed with oral                                  tocolytics.  HISTORY OF PRESENT ILLNESS:   The patient is a 35 year old, gravida 3, para 1-0-1-1, with an EDC of Feb 18, 2000.  Prenatal risk factors include history of a left upper quadrant anechoic cystic mass which was a possible renal cyst which ill need to be followed up with CTs with contrast after delivery.  History of chronic lower abdominal pain and pelvic pressure during this pregnancy.  History of preterm labor managed with oral tocolysis.  PRENATAL LABORATORY DATA:     Blood type is O negative, antibody screen is negative, rubella is immuned.  RPR, HBSAG, and HIV negative.  MSAFP within normal limits.  GBS is negative.  HOSPITAL COURSE:              The patient was admitted for induction of labor secondary to her persistent complaints of abdominal discomfort and pressure. At 38 weeks, her cervix was 70% to 80% effaced, -3 station.  Artificial rupture of membranes revealed copious clear fluid.  Labor progressed to complete dilatation. She delivered normal spontaneous vaginal delivery of a viable female infant over n intact perineum with a second degree perineal laceration.  She did have some postpartum uterine atony which was managed with Methergine.  Her postpartum course, she remained afebrile and was able to be discharged on her second postpartum day. The baby  was O positive so she did receive RhoGAM prior to discharge.  She was discharged with her infant in satisfactory condition.  DISCHARGE INSTRUCTIONS:       Continue prenatal vitamins and iron.  Follow up in the office in four weeks or p.r.n.  Discharge booklet given.  Motrin and Tylox or pain.  Postoperative CBC; hematocrit 38.3, hemoglobin 12.7, and platelets 220. DD:  03/06/00 TD:  03/09/00 Job: [id number]

## 2011-02-14 NOTE — Op Note (Signed)
NAME:  [name], [name]                         ACCOUNT NO.:  [id number]   MEDICAL RECORD NO.:  [id number]                   PATIENT TYPE:  INP   LOCATION:  9129                                 [facility]ACILITY:  [facility]   PHYSICIAN:  Juan H. Lily Peer, M.D.             DATE OF BIRTH:  01-05-1976   DATE OF PROCEDURE:  05/04/2002  DATE OF DISCHARGE:                                 OPERATIVE REPORT   INDICATIONS FOR OPERATION:  A 35 year old gravida 4, para 2, AB1 at 37-1/[redacted]  weeks gestation admitted for induction secondary to anxiety, apprehension,  and history of postural hypotension.  The patient requested elective  permanent sterilization.  On the morning of August 6 after being on Pitocin  all night long her cervix had dilated to 1-2 cm dilated, 90% effaced, and -3  to ballotable presentation.  Artificial rupture of membranes was initiated  and after reduction of amniotic fluid prolapsed cord and the examiner's hand  was evident was able to be slipped back into the uterine cavity and patient  placed in Trendelenburg position and was rushed for an emergency cesarean  section.   PREOPERATIVE DIAGNOSES:  1. Term intrauterine pregnancy.  2. Postural hypotension.  3. Prolapsed cord.  4. Request for elective permanent sterilization.   POSTOPERATIVE DIAGNOSES:  1. Term intrauterine pregnancy.  2. Postural hypotension.  3. Prolapsed cord.  4. Request for elective permanent sterilization.   ANESTHESIA:  General endotracheal anesthesia.   SURGEON:  Juan H. Lily Peer, M.D.   PROCEDURE PERFORMED:  1. Emergency low uterine segment transverse cesarean section.  2. Bilateral tubal sterilization procedure, Pomeroy technique.   FINDINGS:  Viable female infant.  Apgars of 8 and 10.  Delivered at 0715  hours.  7 pounds 4 ounces.  Arterial cord pH 7.32.  Clear amniotic fluid.  Normal maternal pelvic anatomy.   DESCRIPTION OF OPERATION:  After the patient was adequately counseled, she  was rushed to  the operating room for a stat cesarean section.  She underwent  general endotracheal anesthesia.  Foley catheter was placed.  Her abdomen  was prepped and draped in the usual sterile fashion.  A Pfannenstiel skin  incision was made 2 cm above the symphysis pubis.  The incision was carried  down from the skin and subcutaneous tissue down to the rectus fascia whereby  a midline nick was made.  The fascia was incised in a transverse fashion in  the midline peritoneum was entered.  The peritoneal cavity was entered  cautiously.  The bladder flap was established.  The lower uterine segment  was incised in a transverse fashion.  Clear amniotic fluid was present.  The  newborn was delivered.  The naso/oropharyngeal area was bulb suctioned.  Cord was doubly clamped and cut and passed off to the pediatricians who gave  the above mentioned parameters.  Of note, the baby gave an immediate cry  upon suctioning.  After cord  blood was obtained 2 g of Cefotan was  administered to the mother intravenously and the placenta and cord were  passed off the operative field for histologic evaluation.  The uterus was  then exteriorized.  The intrauterine cavity was swept clear of any remaining  products of conception.  The uterine incision was closed with a running  locked stitch of 0 Vicryl suture.  Once this was completed, attention was  placed to the right fallopian tube, proximal one-third portion was held  under tension with a Babcock clamp and a 2 cm segment was suture ligated  with 3-0 Vicryl suture x2 and a 2 cm segment was excised and passed off the  operative field for histologic evaluation.  The remaining stumps were Bovie  cauterized.  A similar procedure was carried out on the contralateral side.  The uterus was then placed back into the abdominal cavity.  The pelvic  cavity was copiously irrigated with normal saline solution.  After  inspection demonstrated adequate hemostasis, closure was started.   The  visceroperitoneum was not reapproximated but the rectus fascia was closed  with a running stitch of 0 Vicryl suture.  The subcutaneous bleeders were  Bovie cauterized.  The skin was reapproximated with skin clips followed by  placing Xeroform and 4x dressing.  The patient was extubated, transferred to  the recovery room with stable vital signs.  Blood loss from procedure was  600 cc.  Urine output was 200 cc of clear and IV fluids were 1500 cc of  lactated Ringer's.  Of note, at approximately 0630 hours this morning  patient had received Solu-Medrol 30 mg IV which she will continue to receive  q.12h. as per the recommendation by the cardiologist for her postural  hypotension and will taper off once she goes home with a Deltasone Pak.                                                Juan H. Lily Peer, M.D.    JHF/MEDQ  D:  05/04/2002  T:  05/06/2002  Job:  [id number]

## 2011-06-23 LAB — PROTIME-INR
INR: 1.1
Prothrombin Time: 13.9

## 2011-06-23 LAB — BASIC METABOLIC PANEL
BUN: 18
Creatinine, Ser: 0.73
GFR calc non Af Amer: >60
Potassium: 3.7

## 2011-06-23 LAB — APTT: aPTT: 30

## 2011-06-23 LAB — CBC
Platelets: 226
WBC: 7.6

## 2011-06-25 LAB — URINALYSIS, ROUTINE W REFLEX MICROSCOPIC
Glucose, UA: NEGATIVE
Ketones, ur: NEGATIVE
Protein, ur: NEGATIVE

## 2011-06-25 LAB — URINE MICROSCOPIC-ADD ON

## 2014-05-11 ENCOUNTER — Ambulatory Visit (HOSPITAL_COMMUNITY)
Admission: RE | Admit: 2014-05-11 | Discharge: 2014-05-11 | Disposition: A | Discharge status: Home / Self Care | Payer: 59 | Source: Ambulatory Visit | Attending: Family Medicine | Admitting: Family Medicine

## 2014-05-11 ENCOUNTER — Other Ambulatory Visit (HOSPITAL_COMMUNITY): Payer: Self-pay | Admitting: Family Medicine

## 2014-05-11 ENCOUNTER — Encounter (INDEPENDENT_AMBULATORY_CARE_PROVIDER_SITE_OTHER): Payer: Self-pay

## 2014-05-11 DIAGNOSIS — R52 Pain, unspecified: Secondary | ICD-10-CM | POA: Diagnosis present

## 2014-05-11 DIAGNOSIS — R071 Chest pain on breathing: Secondary | ICD-10-CM | POA: Diagnosis not present

## 2014-05-11 DIAGNOSIS — R079 Chest pain, unspecified: Secondary | ICD-10-CM | POA: Insufficient documentation

## 2014-12-07 DIAGNOSIS — Z3183 Encounter for assisted reproductive fertility procedure cycle: Secondary | ICD-10-CM | POA: Insufficient documentation

## 2015-11-20 DIAGNOSIS — Z681 Body mass index (BMI) 19 or less, adult: Secondary | ICD-10-CM | POA: Diagnosis not present

## 2015-11-20 DIAGNOSIS — M545 Low back pain: Secondary | ICD-10-CM | POA: Diagnosis not present

## 2015-11-20 DIAGNOSIS — F419 Anxiety disorder, unspecified: Secondary | ICD-10-CM | POA: Diagnosis not present

## 2015-11-20 DIAGNOSIS — Z0000 Encounter for general adult medical examination without abnormal findings: Secondary | ICD-10-CM | POA: Diagnosis not present

## 2015-11-21 MED FILL — CYCLOBENZAPRINE 10 MG TAB: 10 | 30 days supply | Qty: 30 | Fill #1

## 2015-11-21 MED FILL — HYDROCODON-APAP 5-325: 5-325 | 30 days supply | Qty: 60 | Fill #0

## 2015-11-22 DIAGNOSIS — Z0000 Encounter for general adult medical examination without abnormal findings: Secondary | ICD-10-CM | POA: Diagnosis not present

## 2018-04-19 DIAGNOSIS — Z01419 Encounter for gynecological examination (general) (routine) without abnormal findings: Secondary | ICD-10-CM | POA: Diagnosis not present

## 2018-04-19 DIAGNOSIS — Z1231 Encounter for screening mammogram for malignant neoplasm of breast: Secondary | ICD-10-CM | POA: Diagnosis not present

## 2018-04-22 ENCOUNTER — Other Ambulatory Visit: Payer: Self-pay | Admitting: Obstetrics and Gynecology

## 2018-04-22 DIAGNOSIS — Z1231 Encounter for screening mammogram for malignant neoplasm of breast: Secondary | ICD-10-CM

## 2018-04-28 DIAGNOSIS — Z0189 Encounter for other specified special examinations: Secondary | ICD-10-CM | POA: Diagnosis not present

## 2018-04-28 DIAGNOSIS — R0789 Other chest pain: Secondary | ICD-10-CM | POA: Diagnosis not present

## 2018-04-28 DIAGNOSIS — G43109 Migraine with aura, not intractable, without status migrainosus: Secondary | ICD-10-CM | POA: Diagnosis not present

## 2018-04-28 DIAGNOSIS — R002 Palpitations: Secondary | ICD-10-CM | POA: Diagnosis not present

## 2018-05-15 DIAGNOSIS — H524 Presbyopia: Secondary | ICD-10-CM | POA: Diagnosis not present

## 2018-05-26 ENCOUNTER — Ambulatory Visit (INDEPENDENT_AMBULATORY_CARE_PROVIDER_SITE_OTHER): Payer: BLUE CROSS/BLUE SHIELD | Admitting: Student

## 2018-05-26 VITALS — Temp 98.2°F

## 2018-05-26 DIAGNOSIS — J0100 Acute maxillary sinusitis, unspecified: Secondary | ICD-10-CM | POA: Diagnosis not present

## 2018-05-26 MED ORDER — AMOXICILLIN-POT CLAVULANATE 500-125 MG PO TABS: 1.0000 | ORAL_TABLET | Freq: Three times a day (TID) | ORAL | 0 refills | Status: DC

## 2018-05-26 NOTE — Progress Notes (Signed)
History:  [name] is a 42 y.o. No obstetric history on file. who presents to clinic today for sinus pain. Reports symptoms for a month. Has being OTC treatments & ice packs without relief. Reports pain over maxillary sinus that is worse with chewing. Also feels draining and discomfort in her throat. No fever/chill, ear pain, cough.      No Known Allergies  No current outpatient medications on file prior to visit.   No current facility-administered medications on file prior to visit.      The following portions of the patient's history were reviewed and updated as appropriate: allergies, current medications, family history, past medical history, social history, past surgical history and problem list.  Review of Systems:  Other than those mentioned in HPI all ROS negative   Objective:  Physical Exam Temp 98.2 F (36.8 C) (Oral)  CONSTITUTIONAL: Well-developed, well-nourished female in no acute distress.  HEAD: Normocephalic, atraumatic ENT: External right and left ear normal, oropharynx is clear and moist. Maxillary sinus tenderness. CARDIOVASCULAR: Normal heart rate noted, regular rhythm.  RESPIRATORY: Clear to auscultation bilaterally. Effort and breath sounds normal, no problems with respiration noted. HEM/LYMPH/IMMUNOLOGIC: Neck supple, no masses.  Normal thyroid.    Assessment & Plan:  1. Acute non-recurrent maxillary sinusitis -no fever. Maxillary sinus tenderness. Resistant to symptomatic tx. Will rx round of abx. Pt to f/u with PCP if symptoms don't improve or worsen.  - amoxicillin-clavulanate (AUGMENTIN) 500-125 MG tablet; Take 1 tablet (500 mg total) by mouth 3 (three) times daily.  Dispense: 21 tablet; Refill: 0   Jorje Guild, NP 05/26/2018 12:05 PM

## 2018-05-26 NOTE — Patient Instructions (Signed)

## 2018-07-08 ENCOUNTER — Ambulatory Visit (INDEPENDENT_AMBULATORY_CARE_PROVIDER_SITE_OTHER): Payer: BLUE CROSS/BLUE SHIELD | Admitting: General Practice

## 2018-07-08 DIAGNOSIS — N390 Urinary tract infection, site not specified: Secondary | ICD-10-CM

## 2018-07-08 LAB — POCT URINALYSIS DIP (DEVICE)
Glucose, UA: 100 mg/dL — AB
HGB URINE DIPSTICK: NEGATIVE
Ketones, ur: 15 mg/dL — AB
Nitrite: POSITIVE — AB
Protein, ur: 100 mg/dL — AB
Specific Gravity, Urine: 1.01 (ref 1.005–1.030)
UROBILINOGEN UA: 4 mg/dL — AB (ref 0.0–1.0)
pH: 5 (ref 5.0–8.0)

## 2018-07-08 MED ORDER — SULFAMETHOXAZOLE-TRIMETHOPRIM 800-160 MG PO TABS: 1.0000 | ORAL_TABLET | Freq: Two times a day (BID) | ORAL | 0 refills | Status: AC

## 2018-07-08 MED ORDER — PHENAZOPYRIDINE HCL 200 MG PO TABS: 200.0000 mg | ORAL_TABLET | Freq: Three times a day (TID) | ORAL | 0 refills | Status: AC | PRN

## 2018-07-08 NOTE — Progress Notes (Signed)
Patient presents to office today for dysuria, bladder spasms/cramps, flank pain, and reports constant urge to urinate without success. UA reveals UTI, will send urine for culture. Bactrim DS BID x 3 days with pyridium sent in per protocol. Patient informed antibiotic may have to change based off urine culture.

## 2018-07-08 NOTE — Progress Notes (Signed)
I have reviewed the chart and agree with nursing staff's documentation of this patient's encounter.  Mora Bellman, MD 07/08/2018 1:51 PM

## 2018-07-09 LAB — URINE CULTURE: Organism ID, Bacteria: NO GROWTH

## 2018-07-19 DIAGNOSIS — R300 Dysuria: Secondary | ICD-10-CM | POA: Diagnosis not present

## 2018-07-19 DIAGNOSIS — N200 Calculus of kidney: Secondary | ICD-10-CM | POA: Diagnosis not present

## 2018-07-19 DIAGNOSIS — R55 Syncope and collapse: Secondary | ICD-10-CM | POA: Diagnosis not present

## 2018-07-19 DIAGNOSIS — G43909 Migraine, unspecified, not intractable, without status migrainosus: Secondary | ICD-10-CM | POA: Diagnosis not present

## 2018-07-19 DIAGNOSIS — R110 Nausea: Secondary | ICD-10-CM | POA: Diagnosis not present

## 2018-07-22 ENCOUNTER — Emergency Department (HOSPITAL_COMMUNITY): Payer: BLUE CROSS/BLUE SHIELD

## 2018-07-22 ENCOUNTER — Emergency Department (HOSPITAL_COMMUNITY)
Admission: EM | Admit: 2018-07-22 | Discharge: 2018-07-22 | Disposition: A | Discharge status: Home / Self Care | Payer: BLUE CROSS/BLUE SHIELD | Attending: Emergency Medicine | Admitting: Emergency Medicine

## 2018-07-22 ENCOUNTER — Encounter (HOSPITAL_COMMUNITY): Payer: Self-pay | Admitting: *Deleted

## 2018-07-22 DIAGNOSIS — M5136 Other intervertebral disc degeneration, lumbar region: Secondary | ICD-10-CM | POA: Insufficient documentation

## 2018-07-22 DIAGNOSIS — Z79899 Other long term (current) drug therapy: Secondary | ICD-10-CM | POA: Diagnosis not present

## 2018-07-22 DIAGNOSIS — R109 Unspecified abdominal pain: Secondary | ICD-10-CM | POA: Diagnosis not present

## 2018-07-22 DIAGNOSIS — R1031 Right lower quadrant pain: Secondary | ICD-10-CM | POA: Insufficient documentation

## 2018-07-22 DIAGNOSIS — N200 Calculus of kidney: Secondary | ICD-10-CM | POA: Diagnosis not present

## 2018-07-22 HISTORY — DX: Syncope and collapse: R55

## 2018-07-22 LAB — CBC WITH DIFFERENTIAL/PLATELET
Abs Immature Granulocytes: 0.04 10*3/uL (ref 0.00–0.07)
Basophils Absolute: 0.1 10*3/uL (ref 0.0–0.1)
Basophils Relative: 1 %
EOS ABS: 0.1 10*3/uL (ref 0.0–0.5)
Eosinophils Relative: 3 %
HEMATOCRIT: 38.4 % (ref 36.0–46.0)
HEMOGLOBIN: 12.7 g/dL (ref 12.0–15.0)
IMMATURE GRANULOCYTES: 1 %
Lymphocytes Relative: 41 %
Lymphs Abs: 2.3 10*3/uL (ref 0.7–4.0)
MCH: 29.8 pg (ref 26.0–34.0)
MCHC: 33.1 g/dL (ref 30.0–36.0)
MCV: 90.1 fL (ref 80.0–100.0)
MONOS PCT: 6 %
Monocytes Absolute: 0.4 10*3/uL (ref 0.1–1.0)
NRBC: 0 % (ref 0.0–0.2)
Neutro Abs: 2.7 10*3/uL (ref 1.7–7.7)
Neutrophils Relative %: 48 %
Platelets: 243 10*3/uL (ref 150–400)
RBC: 4.26 MIL/uL (ref 3.87–5.11)
RDW: 11.4 % — AB (ref 11.5–15.5)
WBC: 5.6 10*3/uL (ref 4.0–10.5)

## 2018-07-22 LAB — URINALYSIS, ROUTINE W REFLEX MICROSCOPIC
BACTERIA UA: NONE SEEN
Bilirubin Urine: NEGATIVE
Glucose, UA: NEGATIVE mg/dL
KETONES UR: NEGATIVE mg/dL
Leukocytes, UA: NEGATIVE
Nitrite: NEGATIVE
PROTEIN: NEGATIVE mg/dL
Specific Gravity, Urine: 1.018 (ref 1.005–1.030)
pH: 5 (ref 5.0–8.0)

## 2018-07-22 LAB — COMPREHENSIVE METABOLIC PANEL
ALK PHOS: 34 U/L — AB (ref 38–126)
ALT: 12 U/L (ref 0–44)
AST: 18 U/L (ref 15–41)
Albumin: 4.1 g/dL (ref 3.5–5.0)
Anion gap: 8 (ref 5–15)
BILIRUBIN TOTAL: 0.8 mg/dL (ref 0.3–1.2)
BUN: 14 mg/dL (ref 6–20)
CALCIUM: 8.8 mg/dL — AB (ref 8.9–10.3)
CO2: 28 mmol/L (ref 22–32)
CREATININE: 0.54 mg/dL (ref 0.44–1.00)
Chloride: 106 mmol/L (ref 98–111)
GFR calc Af Amer: >60 mL/min (ref >60)
Glucose, Bld: 91 mg/dL (ref 70–99)
POTASSIUM: 4 mmol/L (ref 3.5–5.1)
Sodium: 142 mmol/L (ref 135–145)
TOTAL PROTEIN: 7 g/dL (ref 6.5–8.1)

## 2018-07-22 LAB — I-STAT BETA HCG BLOOD, ED (MC, WL, AP ONLY)

## 2018-07-22 MED ORDER — KETOROLAC TROMETHAMINE 30 MG/ML IJ SOLN
30.0000 mg | Freq: Once | INTRAMUSCULAR | Status: AC
  Administered 2018-07-22: 30 mg via INTRAVENOUS
  Filled 2018-07-22: qty 1

## 2018-07-22 MED ORDER — KETOROLAC TROMETHAMINE 15 MG/ML IJ SOLN
15.0000 mg | Freq: Once | INTRAMUSCULAR | Status: DC
  Filled 2018-07-22: qty 1

## 2018-07-22 MED ORDER — SODIUM CHLORIDE 0.9 % IV BOLUS
1000.0000 mL | Freq: Once | INTRAVENOUS | Status: AC
  Administered 2018-07-22: 1000 mL via INTRAVENOUS

## 2018-07-22 MED ORDER — LIDOCAINE HCL 2 % IJ SOLN
1.5000 mg/kg | Freq: Once | INTRAMUSCULAR | Status: AC
  Administered 2018-07-22: 82 mg via INTRAVENOUS
  Filled 2018-07-22: qty 4.1

## 2018-07-22 NOTE — ED Provider Notes (Signed)
42 year old female received at sign out from [place] [facility] pending labs and imaging.   "[name] is a 42 y.o. female with a past medical history significant for recurrent stones, requiring left partial nephrectomy (2001) and UPJ stents who presents for evaluation of left-sided flank pain.  Pain is been intermittent and ongoing for the last 3 weeks.  Patient states pain was worse on Monday.  She was seen by her primary care provider and was told to come to emergency department if her symptoms change.  Patient is on continuous Flomax for her recurrent nephrolithiasis.  Patient states she has had swelling to her left flank which started on Monday.  States her pain was worse on Monday when she had 10/10 pain and was vomiting because of the pain.  Has not had an episode of emesis since Monday.  Her current pain a 3-4/10.  Describes her pain as a burning sensation.  States she feels like she is not urinating as much as she usually does when she takes Flomax.  Denies fever, chills, nausea, abdominal pain, dysuria, diarrhea.  She states she called her urologist Dr. Amalia Archer, with [facility] urology, and the next available appointment was the first week of December.  Known UTI and flank pain started.  Treated with Bactrim.  Dr. Molli Hazard [facility]."   Physical Exam  BP 122/77   Pulse 67   Temp 98.7 F (37.1 C) (Oral)   Resp 15   Ht 5\' 7"  (1.702 m)   Wt 54 kg   LMP 07/22/2018   SpO2 99%   BMI 18.64 kg/m   Physical Exam  NAD.  ED Course/Procedures     Procedures  MDM   [name] is a 42 y.o. female with a past medical history significant for recurrent stones, requiring left partial nephrectomy (2001) and UPJ stents received at sign out pending labs and imaging for left flank pain.  Pregnancy test is negative.  UA with hemoglobinuria, but otherwise unremarkable.  Leuko-cytosis and labs are otherwise reassuring.  CT renal stone study with 3 nonobstructing punctate stones on  the left.  No hydronephrosis.  Patient was initially given IV lidocaine for renal colic and reported minimal improvement in her symptoms.  Since creatinine is normal, will give the patient Toradol.  She has requested a copy of her CT to follow-up with her urologist Dr. Amalia Archer.  At this time doubt obstructing uropathy, pyelonephritis, UTI, diverticulitis, or other intra-abdominal pathology that is acute.  She is agreeable to the plan at this time.  All questions answered.  Strict return precautions given.  She is hemodynamically stable in no acute distress.  She is safe for discharge home with outpatient follow-up at this time.     Joanne Gavel, PA-C 07/22/18 1911    Hayden Rasmussen, MD 07/23/18 1137

## 2018-07-22 NOTE — Discharge Instructions (Signed)
Your scan today showed 3 punctate stones on the left, but no signs of obstruction or hydronephrosis.  Your urine was not concerning for infection.  You can take Tylenol and ibuprofen at home for pain and your home Zofran for nausea or vomiting.  Follow-up with Dr. Amalia Hailey with urology.  Return to the emergency department if you develop significantly worsening symptoms including high fever, if you stop being able to pee, persistent vomiting despite taking Zofran, or other new, concerning symptoms.

## 2018-07-22 NOTE — ED Triage Notes (Signed)
Pt complains of bilateral flank pain. Pt went to PCP a few a days ago, prescribed Flomax and was told to go to ED if pain worsened or changed. Pt states her pain changed to burning last night and noticed swelling in her left flank. Pt states she does not feel like she is urinating as much.

## 2018-07-22 NOTE — ED Provider Notes (Signed)
[facility] DEPT Provider Note   CSN: [id number] Arrival date & time: 07/22/18  1248   History   Chief Complaint Chief Complaint  Patient presents with  . Flank Pain    HPI [name] is a 42 y.o. female with a past medical history significant for recurrent stones, requiring left partial nephrectomy (2001) and UPJ stents who presents for evaluation of left-sided flank pain.  Pain is been intermittent and ongoing for the last 3 weeks.  Patient states pain was worse on Monday.  She was seen by her primary care provider and was told to come to emergency department if her symptoms change.  Patient is on continuous Flomax for her recurrent nephrolithiasis.  Patient states she has had swelling to her left flank which started on Monday.  States her pain was worse on Monday when she had 10/10 pain and was vomiting because of the pain.  Has not had an episode of emesis since Monday.  Her current pain a 3-4/10.  Describes her pain as a burning sensation.  States she feels like she is not urinating as much as she usually does when she takes Flomax.  Denies fever, chills, nausea, abdominal pain, dysuria, diarrhea.  She states she called her urologist Dr. Amalia Hailey, with [facility] urology, and the next available appointment was the first week of December.  Known UTI and flank pain started.  Treated with Bactrim.  Dr. Molli Hazard [facility].   HPI  Past Medical History:  Diagnosis Date  . Syncope     There are no active problems to display for this patient.   Past Surgical History:  Procedure Laterality Date  . PARTIAL NEPHRECTOMY Left      OB History   None      Home Medications    Prior to Admission medications   Medication Sig Start Date End Date Taking? Authorizing Provider  HYDROcodone-acetaminophen (NORCO/VICODIN) 5-325 MG tablet Take 1 tablet by mouth 4 (four) times daily as needed for moderate pain or severe pain.  07/19/18  Yes [provider]  ondansetron (ZOFRAN-ODT) 4 MG disintegrating tablet Take 4 mg by mouth every 8 (eight) hours as needed for nausea or vomiting.  07/19/18  Yes [provider]  tamsulosin (FLOMAX) 0.4 MG CAPS capsule Take 0.4 mg by mouth daily.  07/19/18  Yes [provider]  amoxicillin-clavulanate (AUGMENTIN) 500-125 MG tablet Take 1 tablet (500 mg total) by mouth 3 (three) times daily. Patient not taking: Reported on 07/22/2018 05/26/18   Jorje Guild, NP    Family History No family history on file.  Social History Social History   Tobacco Use  . Smoking status: Never Smoker  . Smokeless tobacco: Never Used  Substance Use Topics  . Alcohol use: Yes  . Drug use: Not on file     Allergies   Macrobid [nitrofurantoin macrocrystal] and Prednisone   Review of Systems Review of Systems  Constitutional: Negative.   Respiratory: Negative.   Cardiovascular: Negative.   Gastrointestinal: Negative.   Genitourinary: Positive for decreased urine volume, dysuria, flank pain and hematuria. Negative for difficulty urinating, dyspareunia, frequency, menstrual problem, pelvic pain, urgency, vaginal bleeding, vaginal discharge and vaginal pain.  Musculoskeletal: Negative for back pain.  Skin: Negative.   Neurological: Negative.   All other systems reviewed and are negative.    Physical Exam Updated Vital Signs BP 123/80 (BP Location: Left Arm)   Pulse 68   Temp 98.7 F (37.1 C) (Oral)   Resp  16   Ht 5\' 7"  (1.702 m)   Wt 54 kg   LMP 07/22/2018   SpO2 99%   BMI 18.64 kg/m   Physical Exam  Constitutional: She appears well-developed and well-nourished. No distress.  HENT:  Head: Atraumatic.  Mouth/Throat: Oropharynx is clear and moist.  Eyes: Pupils are equal, round, and reactive to light.  Neck: Normal range of motion.  Cardiovascular: Normal rate, regular rhythm, normal heart sounds and intact distal pulses. Exam reveals no gallop and no friction rub.  No  murmur heard. Pulmonary/Chest: Effort normal and breath sounds normal. No stridor. No respiratory distress. She has no wheezes. She has no rales. She exhibits no tenderness.  Abdominal: Soft. Bowel sounds are normal. She exhibits no distension and no mass. There is no hepatosplenomegaly. There is no tenderness. There is CVA tenderness. There is no rigidity, no rebound, no guarding, no tenderness at McBurney's point and negative Murphy's sign. No hernia.  Bilateral flank tenderness to palpation. Left worse than right. Well healed left flank surgical scar. No suprapubic tenderness to palpation.  Musculoskeletal: Normal range of motion.       Back:  Neurological: She is alert.  Skin: Skin is warm and dry. She is not diaphoretic.  Psychiatric: She has a normal mood and affect.  Nursing note and vitals reviewed.    ED Treatments / Results  Labs (all labs ordered are listed, but only abnormal results are displayed) Labs Reviewed  URINALYSIS, ROUTINE W REFLEX MICROSCOPIC  CBC WITH DIFFERENTIAL/PLATELET  COMPREHENSIVE METABOLIC PANEL  I-STAT BETA HCG BLOOD, ED ([facility], [facility], [facility] ONLY)    EKG None  Radiology No results found.  Procedures Procedures (including critical care time)  Medications Ordered in ED Medications  sodium chloride 0.9 % bolus 1,000 mL (1,000 mLs Intravenous New Bag/Given 07/22/18 1559)  ketorolac (TORADOL) 15 MG/ML injection 15 mg (has no administration in time range)     Initial Impression / Assessment and Plan / ED Course  I have reviewed the triage vital signs and the nursing notes.  Pertinent labs & imaging results that were available during my care of the patient were reviewed by me and considered in my medical decision making (see chart for details).  42 year old female who appears otherwise well presents for evaluation of bilateral flank pain.  History of known kidney stones, requiring partial nephrectomy with UPJ stents.  On persistent Flomax for her recurrent  stones.  Sees Dr. Amalia Hailey with [facility] urology.  Pain has been worsening to bilateral flanks, worse on left.  Tenderness to palpation on left flank.  Given history and exam concern for obstructive or infected stone and history of infected urine at onset of symptoms. Will obtain labs, urine and CT stone protocol and reevaluate.  Patient care transferred at shift change to Joline Maxcy, PA-C who will determine ultimate disposition patient.    Final Clinical Impressions(s) / ED Diagnoses   Final diagnoses:  None    ED Discharge Orders    None       Delorese Sellin A, PA-C 07/22/18 1610    Milton Ferguson, MD 07/24/18 2119

## 2018-07-27 DIAGNOSIS — R109 Unspecified abdominal pain: Secondary | ICD-10-CM | POA: Diagnosis not present

## 2018-07-27 DIAGNOSIS — Z87442 Personal history of urinary calculi: Secondary | ICD-10-CM | POA: Diagnosis not present

## 2018-07-27 DIAGNOSIS — Z905 Acquired absence of kidney: Secondary | ICD-10-CM | POA: Diagnosis not present

## 2018-07-30 DIAGNOSIS — N200 Calculus of kidney: Secondary | ICD-10-CM | POA: Diagnosis not present

## 2018-07-30 DIAGNOSIS — R109 Unspecified abdominal pain: Secondary | ICD-10-CM | POA: Diagnosis not present

## 2018-08-17 DIAGNOSIS — N200 Calculus of kidney: Secondary | ICD-10-CM | POA: Diagnosis not present

## 2018-08-17 DIAGNOSIS — Z466 Encounter for fitting and adjustment of urinary device: Secondary | ICD-10-CM | POA: Diagnosis not present

## 2018-09-07 DIAGNOSIS — R8290 Unspecified abnormal findings in urine: Secondary | ICD-10-CM | POA: Diagnosis not present

## 2018-09-07 DIAGNOSIS — R339 Retention of urine, unspecified: Secondary | ICD-10-CM | POA: Diagnosis not present

## 2018-11-16 ENCOUNTER — Ambulatory Visit (INDEPENDENT_AMBULATORY_CARE_PROVIDER_SITE_OTHER): Payer: Managed Care, Other (non HMO) | Admitting: General Practice

## 2018-11-16 DIAGNOSIS — R102 Pelvic and perineal pain: Secondary | ICD-10-CM

## 2018-11-16 DIAGNOSIS — R350 Frequency of micturition: Secondary | ICD-10-CM

## 2018-11-16 DIAGNOSIS — R109 Unspecified abdominal pain: Secondary | ICD-10-CM

## 2018-11-16 DIAGNOSIS — N3000 Acute cystitis without hematuria: Secondary | ICD-10-CM | POA: Diagnosis not present

## 2018-11-16 LAB — POCT URINALYSIS DIP (DEVICE)
Bilirubin Urine: NEGATIVE
Glucose, UA: NEGATIVE mg/dL
Hgb urine dipstick: NEGATIVE
KETONES UR: NEGATIVE mg/dL
Nitrite: POSITIVE — AB
Protein, ur: NEGATIVE mg/dL
SPECIFIC GRAVITY, URINE: 1.025 (ref 1.005–1.030)
Urobilinogen, UA: 0.2 mg/dL (ref 0.0–1.0)
pH: 5.5 (ref 5.0–8.0)

## 2018-11-16 MED ORDER — SULFAMETHOXAZOLE-TRIMETHOPRIM 800-160 MG PO TABS: 1.0000 | ORAL_TABLET | Freq: Two times a day (BID) | ORAL | 0 refills | Status: AC

## 2018-11-16 NOTE — Progress Notes (Signed)
Patient presents to office today for UA due to concern of UTI. Patient reports urinary frequency, pelvic pressure/pain, flank pain, and urgency since last night. UA reveals nitrites. Will send urine for culture & Bactrim per Dr Hulan Fray. Patient informed & had no questions.  Koren Bound RN BSN 11/16/18

## 2018-11-18 LAB — URINE CULTURE

## 2018-11-19 NOTE — Progress Notes (Signed)
I have reviewed this chart and agree with the RN/CMA assessment and management.    Kanetra Ho C Graham Doukas, MD, FACOG Attending Physician, [facility]  

## 2018-11-29 ENCOUNTER — Ambulatory Visit (INDEPENDENT_AMBULATORY_CARE_PROVIDER_SITE_OTHER): Payer: Managed Care, Other (non HMO) | Admitting: *Deleted

## 2018-11-29 DIAGNOSIS — N390 Urinary tract infection, site not specified: Secondary | ICD-10-CM

## 2018-11-29 LAB — POCT URINALYSIS DIP (DEVICE)
Bilirubin Urine: NEGATIVE
GLUCOSE, UA: NEGATIVE mg/dL
HGB URINE DIPSTICK: NEGATIVE
KETONES UR: NEGATIVE mg/dL
LEUKOCYTE UA: NEGATIVE
Nitrite: NEGATIVE
PROTEIN: NEGATIVE mg/dL
SPECIFIC GRAVITY, URINE: 1.025 (ref 1.005–1.030)
Urobilinogen, UA: 0.2 mg/dL (ref 0.0–1.0)
pH: 5.5 (ref 5.0–8.0)

## 2019-07-18 ENCOUNTER — Telehealth: Payer: Self-pay

## 2019-07-18 MED ORDER — PHENAZOPYRIDINE HCL 200 MG PO TABS: 200.0000 mg | ORAL_TABLET | Freq: Three times a day (TID) | ORAL | 0 refills | Status: DC | PRN

## 2019-07-18 MED ORDER — SULFAMETHOXAZOLE-TRIMETHOPRIM 800-160 MG PO TABS: 1.0000 | ORAL_TABLET | Freq: Two times a day (BID) | ORAL | 0 refills | Status: DC

## 2019-07-18 NOTE — Telephone Encounter (Signed)
Request to treat UTI sx's.

## 2019-08-18 DIAGNOSIS — N711 Chronic inflammatory disease of uterus: Secondary | ICD-10-CM | POA: Insufficient documentation

## 2019-09-13 ENCOUNTER — Other Ambulatory Visit: Payer: Self-pay

## 2019-09-13 ENCOUNTER — Ambulatory Visit (INDEPENDENT_AMBULATORY_CARE_PROVIDER_SITE_OTHER): Payer: Managed Care, Other (non HMO) | Admitting: *Deleted

## 2019-09-13 DIAGNOSIS — R300 Dysuria: Secondary | ICD-10-CM

## 2019-09-13 NOTE — Progress Notes (Signed)
Pt reports dysuria which began last evening. She was recently treated for UTI and kidney stones in October. Specimen obtained and POCT urinalysis shows Positive for Nitrites and trace Leukocytes. Specimen will be sent to lab for urine culture. Pt will be notified of results and treatment if any via MyChart.

## 2019-09-15 ENCOUNTER — Encounter: Payer: Self-pay | Admitting: General Practice

## 2019-09-15 LAB — POCT URINALYSIS DIP (DEVICE)
Bilirubin Urine: NEGATIVE
Glucose, UA: NEGATIVE mg/dL
Hgb urine dipstick: NEGATIVE
Ketones, ur: NEGATIVE mg/dL
Nitrite: POSITIVE — AB
Protein, ur: NEGATIVE mg/dL
Specific Gravity, Urine: >=1.03 (ref 1.005–1.030)
Urobilinogen, UA: 0.2 mg/dL (ref 0.0–1.0)
pH: 6 (ref 5.0–8.0)

## 2019-09-19 LAB — URINE CULTURE

## 2019-09-20 ENCOUNTER — Other Ambulatory Visit: Payer: Self-pay | Admitting: *Deleted

## 2019-09-20 DIAGNOSIS — N390 Urinary tract infection, site not specified: Secondary | ICD-10-CM

## 2019-09-20 MED ORDER — CIPROFLOXACIN HCL 500 MG PO TABS: 500.0000 mg | ORAL_TABLET | Freq: Two times a day (BID) | ORAL | 0 refills | Status: DC

## 2019-09-21 MED ORDER — CIPROFLOXACIN HCL 500 MG PO TABS: 500.0000 mg | ORAL_TABLET | Freq: Two times a day (BID) | ORAL | 0 refills | Status: DC

## 2019-09-21 NOTE — Addendum Note (Signed)
Addended by: Langston Reusing on: 09/21/2019 03:40 PM   Modules accepted: Orders

## 2019-09-21 NOTE — Progress Notes (Signed)
Pt notified office stating that her antibiotic prescription was not at the pharmacy. Per chart review, Rx was sent to incorrect pharmacy. New Rx sent to correct pharmacy and pt was notified.

## 2019-09-22 NOTE — Progress Notes (Signed)
Chart reviewed for nurse visit. Agree with plan of care.   Starr Lake, [place] 09/22/2019 6:00 PM

## 2019-10-11 ENCOUNTER — Other Ambulatory Visit: Payer: Self-pay | Admitting: Student

## 2019-10-11 ENCOUNTER — Telehealth: Payer: Self-pay | Admitting: Student

## 2019-10-11 DIAGNOSIS — F411 Generalized anxiety disorder: Secondary | ICD-10-CM

## 2019-10-11 NOTE — Telephone Encounter (Signed)
Phone call on 10/10/2019. Patient reporting increasing anxiety and racing thoughts; reports that she is unable to sleep and has some triggers in her life right now related to family dynamics.Requesting refill on alprazolam. .  She is in the process of finding a PCP to manage her anxiety. She has also spoken to [facility], who will follow up. Denies suicidal thoughts.   2 week supply of alprazolam given with instructions that this is for short term use and she will need to find someone to manage her anxiety medication. Patient understands plan of care.  [name]

## 2019-10-21 ENCOUNTER — Telehealth: Payer: Self-pay

## 2019-10-21 ENCOUNTER — Telehealth: Payer: Self-pay | Admitting: Licensed Clinical Social Worker

## 2019-10-21 MED ORDER — ACYCLOVIR 5 % EX CREA: 1.0000 "application " | TOPICAL_CREAM | CUTANEOUS | 0 refills | Status: DC

## 2019-10-21 NOTE — Telephone Encounter (Signed)
Requests for cream for cold sores around mouth.  Per Dr. Roselie Awkward medication e-prescribed.

## 2019-10-21 NOTE — Telephone Encounter (Signed)
[facility] Clinician attempted to contact Patient to begin services.  Call unsuccessful; unable to leave vmail message.

## 2019-10-24 ENCOUNTER — Institutional Professional Consult (permissible substitution): Payer: Managed Care, Other (non HMO)

## 2019-10-25 ENCOUNTER — Other Ambulatory Visit: Payer: Self-pay | Admitting: Student

## 2019-10-25 ENCOUNTER — Telehealth (INDEPENDENT_AMBULATORY_CARE_PROVIDER_SITE_OTHER): Payer: Managed Care, Other (non HMO) | Admitting: Lactation Services

## 2019-10-25 DIAGNOSIS — Z7689 Persons encountering health services in other specified circumstances: Secondary | ICD-10-CM

## 2019-10-25 DIAGNOSIS — F411 Generalized anxiety disorder: Secondary | ICD-10-CM

## 2019-10-25 NOTE — Telephone Encounter (Signed)
Gave patient option of [place] or [facility] at [facility]. Pt reports either is fine as long as it can be video visit.   Called [facility] to set up appt for pt.   Set up appt for 2/24 at 01:50 My Chart Video visit. Pt notified.

## 2019-10-25 NOTE — Telephone Encounter (Signed)
-----   Message from Starr Lake, [place] sent at 10/25/2019  2:46 PM EST ----- Regarding: patient needs referral to family medicine Patient needs referral to family medicine for anxiety (psychiatry fee is too high for her). Referral was sent a while ago but wasn't scheduled, do you mind scheduling ASAP? Thank you!  Maye Hides

## 2019-11-08 ENCOUNTER — Other Ambulatory Visit: Payer: Self-pay | Admitting: Obstetrics and Gynecology

## 2019-11-08 MED ORDER — AZITHROMYCIN 250 MG PO TABS: ORAL_TABLET | ORAL | 1 refills | Status: DC

## 2019-11-14 ENCOUNTER — Encounter: Payer: Self-pay | Admitting: Internal Medicine

## 2019-11-23 ENCOUNTER — Encounter: Payer: Self-pay | Admitting: Internal Medicine

## 2019-11-23 ENCOUNTER — Telehealth (INDEPENDENT_AMBULATORY_CARE_PROVIDER_SITE_OTHER): Payer: Managed Care, Other (non HMO) | Admitting: Internal Medicine

## 2019-11-23 DIAGNOSIS — F411 Generalized anxiety disorder: Secondary | ICD-10-CM | POA: Diagnosis not present

## 2019-11-23 MED ORDER — BUSPIRONE HCL 5 MG PO TABS: 5.0000 mg | ORAL_TABLET | Freq: Two times a day (BID) | ORAL | 1 refills | Status: DC

## 2019-11-23 MED ORDER — ALPRAZOLAM 0.25 MG PO TABS: 0.2500 mg | ORAL_TABLET | Freq: Three times a day (TID) | ORAL | 2 refills | Status: DC | PRN

## 2019-11-23 NOTE — Progress Notes (Signed)
Virtual Visit via MyChart Note  I connected with [place], on 11/23/2019 at 1:45 PM by MyChart due to the COVID-19 pandemic and verified that I am speaking with the correct person using two identifiers.   Consent: I discussed the limitations, risks, security and privacy concerns of performing an evaluation and management service by telephone and the availability of in person appointments. I also discussed with the patient that there may be a patient responsible charge related to this service. The patient expressed understanding and agreed to proceed.   Location of Patient: Work   Biomedical scientist of Provider: Clinic   Persons participating in Telemedicine visit: Preslyn L Reinig Jeanette Caprice [facility] Dr. Juleen China      History of Present Illness: Patient has a visit to establish care. PMH of generalized anxiety, has been on Xanax. Reports she has tied Zoloft in the past. Also tried Prozac but was extremely sedated from it and tried Celexa but had suicidal ideation and two suicide attempts while taking it. She typically takes her Xanax about once per day. Suffers from continuous worrisome thoughts and has trouble shutting her brain off. Does have panic attacks as well. She has tried counseling in the past but it made her feel like she was crazy. She works for [employer] and sometimes speaks to the [employer] off the record there. Roselyn Reef has given her some breathing exercises. Yukari has also taken up some hobbies such as painting now that all of her children are out of the house. Some acute stressors are the two year anniversary of her cousin's death from an overdose. Has some self blame because he was calling a lot prior to him passing but she didn't realize he was doing cocaine. Additionally, her recent anniversary marks the one year anniversary since her 30 year old son last spoke to her.   GAD 7 : Generalized Anxiety Score 11/23/2019  Nervous, Anxious, on Edge 3  Control/stop worrying 3   Worry too much - different things 3  Trouble relaxing 1  Restless 1  Easily annoyed or irritable 1  Afraid - awful might happen 1  Total GAD 7 Score 13      Past Medical History:  Diagnosis Date  . Chronic endometritis   . Syncope    Allergies  Allergen Reactions  . Macrobid [Nitrofurantoin Macrocrystal] Itching  . Prednisone     Current Outpatient Medications on File Prior to Visit  Medication Sig Dispense Refill  . acyclovir cream (ZOVIRAX) 5 % Apply 1 application topically every 3 (three) hours. 15 g 0  . tamsulosin (FLOMAX) 0.4 MG CAPS capsule Take 0.4 mg by mouth daily as needed.    . ALPRAZolam (XANAX) 0.25 MG tablet Take 0.25 mg by mouth 3 (three) times daily as needed.     No current facility-administered medications on file prior to visit.    Observations/Objective: NAD. Speaking clearly.  Work of breathing normal.  Alert and oriented. Appears anxious and fidgety. Appropriately upset at times during the conversation.   Assessment and Plan: 1. Generalized anxiety disorder GAD-7 score elevated at 13. Would like to try patient on a daily medication in an attempt to better control level of anxiety. Will start with very low dose Buspar and titrate as tolerated/needed. Continue Xanax. Discussed it is a short acting benzo more appropriate for use in acute panic attacks. Ultimate goal would be to wean off as much as possible. Offered counseling with Jasmine, LCSW, for patient to consider.  - busPIRone (BUSPAR) 5  MG tablet; Take 1 tablet (5 mg total) by mouth 2 (two) times daily.  Dispense: 60 tablet; Refill: 1 - ALPRAZolam (XANAX) 0.25 MG tablet; Take 1 tablet (0.25 mg total) by mouth 3 (three) times daily as needed.  Dispense: 90 tablet; Refill: 2   Follow Up Instructions: With Dr. Juleen China in 2-4 weeks for mood check    I discussed the assessment and treatment plan with the patient. The patient was provided an opportunity to ask questions and all were answered. The  patient agreed with the plan and demonstrated an understanding of the instructions.   The patient was advised to call back or seek an in-person evaluation if the symptoms worsen or if the condition fails to improve as anticipated.     I provided 14 minutes total of non-face-to-face time during this encounter including median intraservice time, reviewing previous notes, investigations, ordering medications, medical decision making, coordinating care and patient verbalized understanding at the end of the visit.    Phill Myron, D.O. Primary [facility] at [facility]  11/23/2019, 1:45 PM

## 2020-02-13 ENCOUNTER — Other Ambulatory Visit: Payer: Self-pay

## 2020-02-13 ENCOUNTER — Ambulatory Visit (INDEPENDENT_AMBULATORY_CARE_PROVIDER_SITE_OTHER): Payer: Managed Care, Other (non HMO)

## 2020-02-13 DIAGNOSIS — Z23 Encounter for immunization: Secondary | ICD-10-CM

## 2020-02-15 DIAGNOSIS — Z23 Encounter for immunization: Secondary | ICD-10-CM | POA: Diagnosis not present

## 2020-02-22 ENCOUNTER — Telehealth (INDEPENDENT_AMBULATORY_CARE_PROVIDER_SITE_OTHER): Payer: Managed Care, Other (non HMO) | Admitting: Internal Medicine

## 2020-02-22 ENCOUNTER — Encounter: Payer: Self-pay | Admitting: Internal Medicine

## 2020-02-22 DIAGNOSIS — F411 Generalized anxiety disorder: Secondary | ICD-10-CM | POA: Insufficient documentation

## 2020-02-22 MED ORDER — DULOXETINE HCL 30 MG PO CPEP: ORAL_CAPSULE | ORAL | 1 refills | Status: DC

## 2020-02-22 MED ORDER — ALPRAZOLAM 0.25 MG PO TABS: 0.2500 mg | ORAL_TABLET | Freq: Three times a day (TID) | ORAL | 2 refills | Status: DC | PRN

## 2020-02-22 NOTE — Progress Notes (Signed)
Virtual Visit via MyChart Note  I connected with [name], on 02/22/2020 at 11:09 AM by MyChart due to the COVID-19 pandemic and verified that I am speaking with the correct person using two identifiers.   Consent: I discussed the limitations, risks, security and privacy concerns of performing an evaluation and management service by telephone and the availability of in person appointments. I also discussed with the patient that there may be a patient responsible charge related to this service. The patient expressed understanding and agreed to proceed.   Location of Patient: Home   Location of Provider: Clinic    Persons participating in Telemedicine visit: [name]    History of Present Illness: Patient has a visit to follow up on GAD. At last visit, on 2/24, started patient on Buspar for daily symptoms of anxiety. Patient was unable to tolerate medications due to side effects and stopped taking it about a month ago.  She didn't have time to work the side effects out due to her busy work schedule. Reports she has tied Zoloft in the past. Also tried Prozac but was extremely sedated from it and tried Celexa but had suicidal ideation and two suicide attempts while taking it.   Reports that her anxiety has been "not great". Has had a lot going on with her father-in-law passing away, niece had a baby, son asked step mom to adopt him, etc.   She is seeing LCSW [name] "off the record" at [facility]. Still taking Xanax for anxiety attacks and feels it helps with that.     GAD 7 : Generalized Anxiety Score 02/22/2020 11/23/2019  Nervous, Anxious, on Edge 3 3  Control/stop worrying 3 3  Worry too much - different things 3 3  Trouble relaxing 3 1  Restless 1 1  Easily annoyed or irritable 3 1  Afraid - awful might happen 2 1  Total GAD 7 Score 18 13      Past Medical History:  Diagnosis Date  . Chronic endometritis   . Syncope     Allergies  Allergen Reactions  . Macrobid [Nitrofurantoin Macrocrystal] Itching  . Prednisone     Current Outpatient Medications on File Prior to Visit  Medication Sig Dispense Refill  . acyclovir cream (ZOVIRAX) 5 % Apply 1 application topically every 3 (three) hours. 15 g 0  . ALPRAZolam (XANAX) 0.25 MG tablet Take 1 tablet (0.25 mg total) by mouth 3 (three) times daily as needed. 90 tablet 2  . busPIRone (BUSPAR) 5 MG tablet Take 1 tablet (5 mg total) by mouth 2 (two) times daily. 60 tablet 1  . tamsulosin (FLOMAX) 0.4 MG CAPS capsule Take 0.4 mg by mouth daily as needed.     No current facility-administered medications on file prior to visit.    Observations/Objective: NAD. Speaking clearly.  Work of breathing normal.  Alert and oriented. Mood appropriate.   Assessment and Plan: 1. Generalized anxiety disorder GAD-7 score significantly elevated. Patient has had side effects with SSRIs as well as Buspar. Will try SNRI, Cymbalta. Start at low dose and increase if tolerating side effects. Discussed immediate discontinuation if she were to have suicidal ideations as she did with Celexa. She is considering counseling but has been unable to fit it in with work schedule. Follow up on mood in 4-6 weeks.  - DULoxetine (CYMBALTA) 30 MG capsule; Take one tablet daily x1-2 weeks, then increase to two tablets daily if tolerating.  Dispense: 180  capsule; Refill: 1 - ALPRAZolam (XANAX) 0.25 MG tablet; Take 1 tablet (0.25 mg total) by mouth 3 (three) times daily as needed.  Dispense: 90 tablet; Refill: 2  I discussed the assessment and treatment plan with the patient. The patient was provided an opportunity to ask questions and all were answered. The patient agreed with the plan and demonstrated an understanding of the instructions.   The patient was advised to call back or seek an in-person evaluation if the symptoms worsen or if the condition fails to improve as anticipated.     I  provided 18 minutes total of non-face-to-face time during this encounter including median intraservice time, reviewing previous notes, investigations, ordering medications, medical decision making, coordinating care and patient verbalized understanding at the end of the visit.    [name], D.O. Primary Care at [facility]  02/22/2020, 11:09 AM

## 2020-03-01 ENCOUNTER — Telehealth (INDEPENDENT_AMBULATORY_CARE_PROVIDER_SITE_OTHER): Payer: Managed Care, Other (non HMO) | Admitting: Licensed Clinical Social Worker

## 2020-03-01 DIAGNOSIS — F411 Generalized anxiety disorder: Secondary | ICD-10-CM

## 2020-03-01 NOTE — Telephone Encounter (Signed)
Contacted on this day to discuss behavioral health services; unsuccessful

## 2020-03-06 ENCOUNTER — Other Ambulatory Visit: Payer: Managed Care, Other (non HMO)

## 2020-03-06 ENCOUNTER — Other Ambulatory Visit: Payer: Self-pay

## 2020-03-06 DIAGNOSIS — W57XXXA Bitten or stung by nonvenomous insect and other nonvenomous arthropods, initial encounter: Secondary | ICD-10-CM

## 2020-03-08 ENCOUNTER — Other Ambulatory Visit: Payer: Self-pay

## 2020-03-08 DIAGNOSIS — Z0000 Encounter for general adult medical examination without abnormal findings: Secondary | ICD-10-CM

## 2020-03-08 LAB — ROCKY MTN SPOTTED FVR ABS PNL(IGG+IGM)
RMSF IgG: NEGATIVE
RMSF IgM: 0.54 index (ref 0.00–0.89)

## 2020-03-08 LAB — HEPATIC FUNCTION PANEL
ALT: 8 IU/L (ref 0–32)
AST: 16 IU/L (ref 0–40)
Albumin: 4.6 g/dL (ref 3.8–4.8)
Alkaline Phosphatase: 48 IU/L (ref 48–121)
Bilirubin Total: 1.3 mg/dL — ABNORMAL HIGH (ref 0.0–1.2)
Bilirubin, Direct: 0.23 mg/dL (ref 0.00–0.40)
Total Protein: 7.2 g/dL (ref 6.0–8.5)

## 2020-03-08 LAB — CBC
Hematocrit: 45.4 % (ref 34.0–46.6)
Hemoglobin: 15.2 g/dL (ref 11.1–15.9)
MCH: 30.1 pg (ref 26.6–33.0)
MCHC: 33.5 g/dL (ref 31.5–35.7)
MCV: 90 fL (ref 79–97)
Platelets: 268 10*3/uL (ref 150–450)
RBC: 5.05 x10E6/uL (ref 3.77–5.28)
RDW: 11.8 % (ref 11.7–15.4)
WBC: 7.3 10*3/uL (ref 3.4–10.8)

## 2020-03-08 LAB — LYME, IGM, EARLY TEST/REFLEX: LYME DISEASE AB, QUANT, IGM: <0.8 index (ref 0.00–0.79)

## 2020-03-08 NOTE — Progress Notes (Signed)
[id number]  

## 2020-03-12 NOTE — Progress Notes (Signed)
Pt here for tdap per patient request.

## 2020-03-13 LAB — TSH: TSH: 1.4 u[IU]/mL (ref 0.450–4.500)

## 2020-03-13 LAB — VITAMIN D 25 HYDROXY (VIT D DEFICIENCY, FRACTURES): Vit D, 25-Hydroxy: 31.5 ng/mL (ref 30.0–100.0)

## 2020-03-13 LAB — SPECIMEN STATUS REPORT

## 2020-04-10 ENCOUNTER — Other Ambulatory Visit: Payer: Self-pay

## 2020-04-10 ENCOUNTER — Other Ambulatory Visit (INDEPENDENT_AMBULATORY_CARE_PROVIDER_SITE_OTHER): Payer: Managed Care, Other (non HMO)

## 2020-04-10 DIAGNOSIS — K9049 Malabsorption due to intolerance, not elsewhere classified: Secondary | ICD-10-CM

## 2020-04-16 LAB — ALPHA-GAL PANEL
Alpha Gal IgE*: 0.96 kU/L — ABNORMAL HIGH (ref <0.10)
Beef (Bos spp) IgE: 0.41 kU/L — ABNORMAL HIGH (ref <0.35)
Class Interpretation: 0
Class Interpretation: 1
Lamb/Mutton (Ovis spp) IgE: <0.1 kU/L (ref <0.35)
Pork (Sus spp) IgE: 0.17 kU/L (ref <0.35)

## 2020-04-17 ENCOUNTER — Ambulatory Visit (INDEPENDENT_AMBULATORY_CARE_PROVIDER_SITE_OTHER): Payer: Managed Care, Other (non HMO) | Admitting: Student

## 2020-04-17 ENCOUNTER — Other Ambulatory Visit: Payer: Self-pay

## 2020-04-17 ENCOUNTER — Encounter: Payer: Self-pay | Admitting: Student

## 2020-04-17 VITALS — BP 121/79 | HR 64 | Ht 67.0 in | Wt 131.6 lb

## 2020-04-17 DIAGNOSIS — R109 Unspecified abdominal pain: Secondary | ICD-10-CM

## 2020-04-17 DIAGNOSIS — Z01419 Encounter for gynecological examination (general) (routine) without abnormal findings: Secondary | ICD-10-CM | POA: Insufficient documentation

## 2020-04-17 DIAGNOSIS — F419 Anxiety disorder, unspecified: Secondary | ICD-10-CM | POA: Insufficient documentation

## 2020-04-17 DIAGNOSIS — Z8742 Personal history of other diseases of the female genital tract: Secondary | ICD-10-CM | POA: Diagnosis not present

## 2020-04-17 NOTE — Addendum Note (Signed)
Addended by: Carolynne Edouard on: 04/17/2020 05:12 PM   Modules accepted: Orders

## 2020-04-17 NOTE — Progress Notes (Signed)
Pain around left ovary.

## 2020-04-17 NOTE — Progress Notes (Addendum)
Patient ID: [name], female   DOB: Feb 17, 1976, 44 y.o.   MRN: [id number]  History:  [name] is a 44 y.o.  [phone] who presents to clinic today for well woman exam.    She reports pain around her left overy but also in the front. She sometimes feels cramping. She has normal periods; her July one has been a lighter. She was recently diagnosed with Alpha-GAL meat allergy after tick bite.   She has a history of endometritis (treated with antibiotics), ovarian cysts. She reports abnormal pap smear but last one was negative in 2019. She had cyrotherapy on her cervix but was closely followed by her other gyn, and was cleared by her provider at [facility]. Uterine cancer runs in her family.  Patient also wants blood draws for allergy testing, Lipids, triglycerides, for her health insurance.   Last mammo was Nov 2020, patient due in November.  Last pap was 01/2018 and was normal (at [facility]).  The following portions of the patient's history were reviewed and updated as appropriate: allergies, current medications, family history, past medical history, social history, past surgical history and problem list.  Review of Systems:  Review of Systems  Constitutional: Negative.   HENT: Negative.   Respiratory: Negative.   Cardiovascular: Negative.   Gastrointestinal: Positive for abdominal pain.  Genitourinary: Negative.   Skin: Negative.   Neurological: Negative.       Objective:  Physical Exam BP 121/79    Pulse 64    Ht 5\' 7"  (1.702 m)    Wt 131 lb 9.6 oz (59.7 kg)    LMP 04/10/2020 (Exact Date)    BMI 20.61 kg/m  Physical Exam Exam conducted with a chaperone present (NE[facility]; no cysts palpated on left or right ovary, no CMT, suprapubic or adnexal tenderness. ).  Abdominal:     General: Abdomen is flat. There is no distension.     Tenderness: There is no abdominal tenderness.  Genitourinary:    General: Normal vulva.     Vagina: No vaginal discharge.  Musculoskeletal:         General: Normal range of motion.  Skin:    General: Skin is warm and dry.  Neurological:     General: No focal deficit present.     Mental Status: She is alert.       Labs and Imaging No results found for this or any previous visit (from the past 24 hour(s)).  No results found.   Assessment & Plan:   1. Well woman exam   2. Anxiety    2. Patient not due for mammo; last mammo was 07/2019 and normal.  3. Pap due May 2022.  4. Bimanual exam is benign, at this point no need for imaging of ovaries.  - Approximately 30 minutes of face-to-face time was spent with this patient   Starr [place], CNM 04/17/2020 12:16 PM

## 2020-04-20 LAB — RPR: RPR Ser Ql: NONREACTIVE

## 2020-04-20 LAB — LIPID PANEL W/O CHOL/HDL RATIO
Cholesterol, Total: 161 mg/dL (ref 100–199)
HDL: 46 mg/dL (ref >39)
LDL Chol Calc (NIH): 82 mg/dL (ref 0–99)
Triglycerides: 198 mg/dL — ABNORMAL HIGH (ref 0–149)
VLDL Cholesterol Cal: 33 mg/dL (ref 5–40)

## 2020-04-20 LAB — COMPREHENSIVE METABOLIC PANEL
ALT: 5 IU/L (ref 0–32)
AST: 15 IU/L (ref 0–40)
Albumin/Globulin Ratio: 1.4 (ref 1.2–2.2)
Albumin: 4 g/dL (ref 3.8–4.8)
Alkaline Phosphatase: 52 IU/L (ref 48–121)
BUN/Creatinine Ratio: 20 (ref 9–23)
BUN: 14 mg/dL (ref 6–24)
Bilirubin Total: 0.8 mg/dL (ref 0.0–1.2)
CO2: 21 mmol/L (ref 20–29)
Calcium: 8.7 mg/dL (ref 8.7–10.2)
Chloride: 104 mmol/L (ref 96–106)
Creatinine, Ser: 0.71 mg/dL (ref 0.57–1.00)
GFR calc Af Amer: 121 mL/min/{1.73_m2} (ref >59)
GFR calc non Af Amer: 105 mL/min/{1.73_m2} (ref >59)
Globulin, Total: 2.8 g/dL (ref 1.5–4.5)
Glucose: 75 mg/dL (ref 65–99)
Potassium: 3.8 mmol/L (ref 3.5–5.2)
Sodium: 141 mmol/L (ref 134–144)
Total Protein: 6.8 g/dL (ref 6.0–8.5)

## 2020-04-20 LAB — HEPATITIS C ANTIBODY: Hep C Virus Ab: <0.1 s/co ratio (ref 0.0–0.9)

## 2020-04-20 LAB — CBC
Hematocrit: 39.8 % (ref 34.0–46.6)
Hemoglobin: 13.4 g/dL (ref 11.1–15.9)
MCH: 30.7 pg (ref 26.6–33.0)
MCHC: 33.7 g/dL (ref 31.5–35.7)
MCV: 91 fL (ref 79–97)
Platelets: 291 10*3/uL (ref 150–450)
RBC: 4.36 x10E6/uL (ref 3.77–5.28)
RDW: 11.8 % (ref 11.7–15.4)
WBC: 8.3 10*3/uL (ref 3.4–10.8)

## 2020-04-20 LAB — HGB A1C W/O EAG: Hgb A1c MFr Bld: 5.1 % (ref 4.8–5.6)

## 2020-04-20 LAB — ALLERGEN GLUTEN F79: Allergen Gluten IgE: <0.1 kU/L

## 2020-04-20 LAB — SARS-COV-2 SEMI-QUANTITATIVE TOTAL ANTIBODY, SPIKE
SARS-CoV-2 Semi-Quant Total Ab: 0.6 U/mL (ref <0.8)
SARS-CoV-2 Spike Ab Interp: NEGATIVE

## 2020-04-20 LAB — HIV ANTIBODY (ROUTINE TESTING W REFLEX): HIV Screen 4th Generation wRfx: NONREACTIVE

## 2020-04-23 ENCOUNTER — Other Ambulatory Visit: Payer: Self-pay | Admitting: Internal Medicine

## 2020-04-23 MED ORDER — EPINEPHRINE 0.3 MG/0.3ML IJ SOAJ: 0.3000 mg | INTRAMUSCULAR | 1 refills | Status: DC | PRN

## 2020-04-26 ENCOUNTER — Other Ambulatory Visit: Payer: Self-pay | Admitting: Obstetrics and Gynecology

## 2020-04-26 ENCOUNTER — Encounter: Payer: Self-pay | Admitting: Obstetrics and Gynecology

## 2020-04-26 MED ORDER — TAMSULOSIN HCL 0.4 MG PO CAPS: 0.4000 mg | ORAL_CAPSULE | Freq: Every day | ORAL | 0 refills | Status: DC | PRN

## 2020-04-27 ENCOUNTER — Other Ambulatory Visit: Payer: Self-pay | Admitting: Obstetrics and Gynecology

## 2020-04-27 MED ORDER — ALFUZOSIN HCL ER 10 MG PO TB24: 10.0000 mg | ORAL_TABLET | Freq: Every day | ORAL | 0 refills | Status: AC

## 2020-04-30 ENCOUNTER — Other Ambulatory Visit: Payer: Self-pay | Admitting: Student

## 2020-04-30 DIAGNOSIS — Z01419 Encounter for gynecological examination (general) (routine) without abnormal findings: Secondary | ICD-10-CM

## 2020-04-30 DIAGNOSIS — E739 Lactose intolerance, unspecified: Secondary | ICD-10-CM

## 2020-05-02 LAB — LIPID PANEL WITH LDL/HDL RATIO
Cholesterol, Total: 177 mg/dL (ref 100–199)
HDL: 51 mg/dL (ref >39)
LDL Chol Calc (NIH): 112 mg/dL — ABNORMAL HIGH (ref 0–99)
LDL/HDL Ratio: 2.2 ratio (ref 0.0–3.2)
Triglycerides: 75 mg/dL (ref 0–149)
VLDL Cholesterol Cal: 14 mg/dL (ref 5–40)

## 2020-05-02 LAB — ALLERGEN MILK: Milk IgE: 0.1 kU/L — AB

## 2020-06-21 ENCOUNTER — Other Ambulatory Visit: Payer: Self-pay

## 2020-06-21 ENCOUNTER — Ambulatory Visit: Payer: Managed Care, Other (non HMO) | Admitting: Allergy and Immunology

## 2020-06-21 ENCOUNTER — Encounter: Payer: Self-pay | Admitting: Allergy and Immunology

## 2020-06-21 VITALS — BP 102/68 | HR 64 | Resp 16 | Ht 67.0 in | Wt 129.4 lb

## 2020-06-21 DIAGNOSIS — M25541 Pain in joints of right hand: Secondary | ICD-10-CM | POA: Diagnosis not present

## 2020-06-21 DIAGNOSIS — T7800XA Anaphylactic reaction due to unspecified food, initial encounter: Secondary | ICD-10-CM

## 2020-06-21 DIAGNOSIS — T63481A Toxic effect of venom of other arthropod, accidental (unintentional), initial encounter: Secondary | ICD-10-CM | POA: Diagnosis not present

## 2020-06-21 DIAGNOSIS — J3089 Other allergic rhinitis: Secondary | ICD-10-CM | POA: Diagnosis not present

## 2020-06-21 DIAGNOSIS — M25542 Pain in joints of left hand: Secondary | ICD-10-CM

## 2020-06-21 NOTE — Patient Instructions (Addendum)
  1.  Allergen avoidance measures - mammal meat consumption  2.  Auvi-Q/EpiPen, Benadryl, MD/ER evaluation for allergic reaction  3.  Daily cetirizine 10 mg - 1 tablet 1 time per day  4.  Blood - tryptase, alpha gal panel, milk panel, hymenoptera panel, CRP, CCP, RA, ANA w/R,   5. Lactose intolerance? Can try lactaid supplementation or lactose free milk  6. Further evaluation? Yes, if with recurrent reactions  7. Plan for fall flu vaccine

## 2020-06-21 NOTE — Progress Notes (Signed)
[name]   NEW PATIENT NOTE  Referring Provider: No ref. provider found Primary Provider: Nicolette Bang, DO Date of office visit: 06/21/2020    Subjective:   Chief Complaint:  [name] (DOB: 08/19/1976) is a 44 y.o. female who presents to the clinic on 06/21/2020 with a chief complaint of Alpha Gal .     HPI: [name] presents to this clinic in evaluation of several issues.  First, she apparently has the diagnosis of alpha gal syndrome.  She has noticed that she develops intense GI distress manifested as diarrhea and vomiting and abdominal pain and some global itchiness soon after eating mammal meat.  Apparently this has been a progressive issue during the summer and she has been mammal meat free for the past month which has made a significant impact in her recurrent episodes.  Second, she has also been having problems consuming dairy.  She gets nausea and bloating which has been an issue for several years.  This occurs with ice cream and cheese.  She may have used some lactose-free ice cream in the past and still had the same problem.  She has never tried lactose-free milk.  Third, even though she avoids mammal and dairy she still continues to have "allergic reactions".  She gets these reactions where she gets nausea and some bloating and then may be some red spots on her skin that are itchy and then feeling as though her throat is somewhat itchy.  These episodes last about 2 hours and she usually treats them with Zyrtec.  They're occurring about 1 time per week since June 2021.  There is not really an obvious provoking factor giving rise to this issue.  Fourth, 2 weeks ago, she apparently had a very bad reaction after being stung by some unidentified flying insect that flew into her car.  She was stung on the leg and she developed a large local reaction and facial swelling and a diffuse red rash without any other  associated systemic or constitutional symptoms for which she took Benadryl.  She apparently does have an EpiPen but she did not use that device.  She was given an EpiPen because many years ago she apparently had a "bee reaction" as a child but she can't really remember the details other than a large local reaction.  Fifth, she does have an issue of "sinus".  Her sinus is manifested as a fullness around her sinus cavities without any significant nasal congestion or sneezing or nose blowing or anosmia.  She doesn't really treat this issue other than to use some nasal saline.  This appears to occur during the fall and early spring.  Sixth, she has joint pain.  Apparently this summer she developed elbow and wrist and ankle and knee stiffness and pain that waxes and wanes requiring her to use ibuprofen about twice a week.  She has obtained a J&J Covid vaccine.  Past Medical History:  Diagnosis Date  . Chronic endometritis   . Syncope     Past Surgical History:  Procedure Laterality Date  . CESAREAN SECTION CLASSICAL    . PARTIAL NEPHRECTOMY Left   . TUBAL LIGATION      Allergies as of 06/21/2020      Reactions   Beef-derived Products    Severe stomach cramping, diarrhea   Codeine Nausea And Vomiting   Macrobid [nitrofurantoin Macrocrystal] Itching   Meat [alpha-gal]    Prednisone  Medication List      acyclovir cream 5 % Commonly known as: ZOVIRAX Apply 1 application topically every 3 (three) hours.   ALPRAZolam 0.25 MG tablet Commonly known as: XANAX Take 1 tablet (0.25 mg total) by mouth 3 (three) times daily as needed.   DULoxetine 30 MG capsule Commonly known as: Cymbalta Take one tablet daily x1-2 weeks, then increase to two tablets daily if tolerating.   EPINEPHrine 0.3 mg/0.3 mL Soaj injection Commonly known as: EPI-PEN Inject 0.3 mLs (0.3 mg total) into the muscle as needed for anaphylaxis.       Review of systems negative except as noted in HPI / PMHx or  noted below:  Review of Systems  Constitutional: Negative.   HENT: Negative.   Eyes: Negative.   Respiratory: Negative.   Cardiovascular: Negative.   Gastrointestinal: Negative.   Genitourinary: Negative.   Musculoskeletal: Negative.   Skin: Negative.   Neurological: Negative.   Endo/Heme/Allergies: Negative.   Psychiatric/Behavioral: Negative.     Family History  Problem Relation Age of Onset  . Skin cancer Mother   . Hyperlipidemia Father   . Hyperlipidemia Brother   . Hypertension Maternal Grandmother   . Depression Maternal Aunt     Social History   Socioeconomic History  . Marital status: Married    Spouse name: Not on file  . Number of children: Not on file  . Years of education: Not on file  . Highest education level: Not on file  Occupational History  . Not on file  Tobacco Use  . Smoking status: Former Research scientist (life sciences)  . Smokeless tobacco: Never Used  . Tobacco comment: "smoke occasionally when younger"  Substance and Sexual Activity  . Alcohol use: Yes  . Drug use: Never  . Sexual activity: Yes    Partners: Male  Other Topics Concern  . Not on file  Social History Narrative  . Not on file    Environmental and Social history  Lives in a mobile home with a dry environment, no animals located inside the household, no carpet in the bedroom, no plastic on the bed, no plastic on the pillow, no smoking ongoing with inside the household.  She works as a Charity fundraiser.  Objective:   Vitals:   06/21/20 0949  BP: 102/68  Pulse: 64  Resp: 16  SpO2: 99%   Height: 5\' 7"  (170.2 cm) Weight: 129 lb 6.4 oz (58.7 kg)  Physical Exam Constitutional:      Appearance: She is not diaphoretic.  HENT:     Head: Normocephalic.     Right Ear: Tympanic membrane, ear canal and external ear normal.     Left Ear: Tympanic membrane, ear canal and external ear normal.     Nose: Nose normal. No mucosal edema or rhinorrhea.     Mouth/Throat:     Pharynx: Uvula midline. No  oropharyngeal exudate.  Eyes:     Conjunctiva/sclera: Conjunctivae normal.  Neck:     Thyroid: No thyromegaly.     Trachea: Trachea normal. No tracheal tenderness or tracheal deviation.  Cardiovascular:     Rate and Rhythm: Normal rate and regular rhythm.     Heart sounds: Normal heart sounds, S1 normal and S2 normal. No murmur heard.   Pulmonary:     Effort: No respiratory distress.     Breath sounds: Normal breath sounds. No stridor. No wheezing or rales.  Lymphadenopathy:     Head:     Right side of head: No tonsillar adenopathy.  Left side of head: No tonsillar adenopathy.     Cervical: No cervical adenopathy.  Skin:    Findings: No erythema or rash.     Nails: There is no clubbing.  Neurological:     Mental Status: She is alert.     Diagnostics: Allergy skin tests were performed.  She demonstrated hypersensitivity to dog.  She did not demonstrate any hypersensitivity against a screening panel of foods.  Her histamine control was relatively small.  Results of blood tests obtained 10 April 2020 identified IgE directed against alpha gal at 0.96 KU/L, beef 0.41 KU/L, pork 0.17 KU/L.  Review of blood tests obtained 22 July 2018 identified WBC 5.6, absolute eosinophil 100, absolute lymphocyte 2300, hemoglobin 12.7, platelet 243.  Assessment and Plan:    1. Allergy with anaphylaxis due to food   2. 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 Arthralgia of both hands   4. Perennial allergic rhinitis     1.  Allergen avoidance measures - mammal meat consumption  2.  Auvi-Q/EpiPen, Benadryl, MD/ER evaluation for allergic reaction  3.  Daily cetirizine 10 mg - 1 tablet 1 time per day  4.  Blood - tryptase, alpha gal panel, milk panel, hymenoptera panel, CRP, CCP, RA, ANA w/R,   5. Lactose intolerance? Can try lactaid supplementation or lactose free milk  6. Further evaluation? Yes, if with recurrent reactions  7. Plan for fall flu vaccine  We will further  evaluate [place] for atopic disease with the blood tests as noted above and is well have her screen for a connective tissue disease associated with her arthralgia.  She is having recurrent allergic reactions without an obvious etiologic factor and I have recommended that she use a daily cetirizine in an attempt to prevent these reactions from occurring.  I have encouraged her to use an injectable epinephrine device should she develop an allergic reaction in the future.  The etiologic issue associated with her dairy problems is not entirely clear and this may all be secondary to lactose intolerance rather than a true allergic reaction.  We will make a determination about challenging her with lactose-free dairy after we have her blood test to return for review.   Jiles Prows, MD Allergy / Immunology [facility]

## 2020-06-26 ENCOUNTER — Other Ambulatory Visit: Payer: Self-pay

## 2020-06-26 ENCOUNTER — Encounter: Payer: Self-pay | Admitting: Cardiology

## 2020-06-26 ENCOUNTER — Ambulatory Visit: Payer: Managed Care, Other (non HMO) | Admitting: Cardiology

## 2020-06-26 VITALS — BP 110/64 | HR 77 | Resp 16 | Ht 67.0 in | Wt 130.0 lb

## 2020-06-26 DIAGNOSIS — R42 Dizziness and giddiness: Secondary | ICD-10-CM

## 2020-06-26 DIAGNOSIS — I495 Sick sinus syndrome: Secondary | ICD-10-CM

## 2020-06-26 DIAGNOSIS — R002 Palpitations: Secondary | ICD-10-CM

## 2020-06-26 NOTE — Progress Notes (Signed)
Primary Physician/Referring:  Nicolette Bang, DO  Patient ID: [name], female    DOB: 08-21-1976, 44 y.o.   MRN: [id number]  Chief Complaint  Patient presents with  . Chest Pain  . Palpitations  . Follow-up   HPI:    [name]  is a 44 y.o. Caucasian female with longstanding history of mild orthostatic hypotension and chronic palpitations, symptoms controlled with good hydration and excess salt intake, chronic migraine with aura who I had last seen in 2019 presents to reestablish care.  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  Over the past 2 months she has again started developing episodes of palpitations lasting few seconds but one episode lasted for an hour where she was at workplace when she suddenly felt extremely hot, dizzy and heart was racing and at that time the blood pressure was told to be very low.  She recuperated after an hour and drank plenty of fluids and rested and felt well.  In view of her symptoms she now wanted to be re-establish with me again.  There is no frank syncope, there is no family history of sudden cardiac death.  Past Medical History:  Diagnosis Date  . Anxiety and depression   . Chronic endometritis   . Syncope    Past Surgical History:  Procedure Laterality Date  . CESAREAN SECTION CLASSICAL    . PARTIAL NEPHRECTOMY Left   . TUBAL LIGATION     Family History  Problem Relation Age of Onset  . Skin cancer Mother   . Hyperlipidemia Father   . Hyperlipidemia Brother   . Hypertension Maternal Grandmother   . Depression Maternal Aunt     Social History   Tobacco Use  . Smoking status: Former Smoker    Packs/day: 0.25    Types: Cigarettes  .  Smokeless tobacco: Never Used  . Tobacco comment: "smoke occasionally when younger"  Substance Use Topics  . Alcohol use: Yes   Marital Status: Married  ROS  Review of Systems  Cardiovascular: Positive for irregular heartbeat. Negative for chest pain, dyspnea on exertion and leg swelling.  Gastrointestinal: Negative for melena.  Neurological: Positive for dizziness.   Objective  Blood pressure 110/64, pulse 77, resp. rate 16, height 5\' 7"  (1.702 m), weight 130 lb (59 kg), SpO2 99 %.  Vitals with BMI 06/26/2020 06/21/2020 04/17/2020  Height 5\' 7"  5\' 7"  5\' 7"   Weight 130 lbs 129 lbs 6 oz 131 lbs 10 oz  BMI 20.36 96.28 36.62  Systolic 947 654 650  Diastolic 64 68 79  Pulse 77 64 64   Orthostatic VS for the past 72 hrs (Last 3 readings):  Orthostatic BP Patient Position BP Location Cuff Size Orthostatic Pulse  06/26/20 1546 106/70 Standing Left Arm Normal 73  06/26/20 1545 107/71 Sitting Left Arm Normal 73  06/26/20 1544 109/68 Supine Left Arm Normal 72      Physical Exam Nursing note reviewed: petite.  Cardiovascular:     Rate and Rhythm: Normal rate and regular rhythm.     Pulses: Intact distal pulses.     Heart sounds: Normal heart sounds. No murmur heard.  No gallop.      Comments: No leg edema, no  JVD. Pulmonary:     Effort: Pulmonary effort is normal.     Breath sounds: Normal breath sounds.  Abdominal:     General: Bowel sounds are normal.     Palpations: Abdomen is soft.    Laboratory examination:   Recent Labs    04/17/20 1659  NA 141  K 3.8  CL 104  CO2 21  GLUCOSE 75  BUN 14  CREATININE 0.71  CALCIUM 8.7  GFRNONAA 105  GFRAA 121   CrCl cannot be calculated (Patient's most recent lab result is older than the maximum 21 days allowed.).  CMP Latest Ref Rng & Units 04/17/2020 03/06/2020 07/22/2018  Glucose 65 - 99 mg/dL 75 - 91  BUN 6 - 24 mg/dL 14 - 14  Creatinine 0.57 - 1.00 mg/dL 0.71 - 0.54  Sodium 134 - 144 mmol/L 141 - 142  Potassium 3.5 - 5.2  mmol/L 3.8 - 4.0  Chloride 96 - 106 mmol/L 104 - 106  CO2 20 - 29 mmol/L 21 - 28  Calcium 8.7 - 10.2 mg/dL 8.7 - 8.8(L)  Total Protein 6.0 - 8.5 g/dL 6.8 7.2 7.0  Total Bilirubin 0.0 - 1.2 mg/dL 0.8 1.3(H) 0.8  Alkaline Phos 48 - 121 IU/L 52 48 34(L)  AST 0 - 40 IU/L 15 16 18   ALT 0 - 32 IU/L 5 8 12    CBC Latest Ref Rng & Units 04/17/2020 03/06/2020 07/22/2018  WBC 3.4 - 10.8 x10E3/uL 8.3 7.3 5.6  Hemoglobin 11.1 - 15.9 g/dL 13.4 15.2 12.7  Hematocrit 34.0 - 46.6 % 39.8 45.4 38.4  Platelets 150 - 450 x10E3/uL 291 268 243    Lipid Panel Recent Labs    04/17/20 1659 04/30/20 0923  CHOL 161 177  TRIG 198* 75  LDLCALC 82 112*  HDL 46 51    HEMOGLOBIN A1C Lab Results  Component Value Date   HGBA1C 5.1 04/17/2020   TSH Recent Labs    03/06/20 0938  TSH 1.400    Medications and allergies   Allergies  Allergen Reactions  . Beef-Derived Products     Severe stomach cramping, diarrhea  . Codeine Nausea And Vomiting  . Macrobid [Nitrofurantoin Macrocrystal] Itching  . Meat [Alpha-Gal]   . Prednisone      Outpatient Medications Prior to Visit  Medication Sig Dispense Refill  . acyclovir cream (ZOVIRAX) 5 % Apply 1 application topically every 3 (three) hours. 15 g 0  . albuterol (VENTOLIN HFA) 108 (90 Base) MCG/ACT inhaler as needed.    . ALPRAZolam (XANAX) 0.25 MG tablet Take 1 tablet (0.25 mg total) by mouth 3 (three) times daily as needed. 90 tablet 2  . EPINEPHrine 0.3 mg/0.3 mL IJ SOAJ injection Inject 0.3 mLs (0.3 mg total) into the muscle as needed for anaphylaxis. 2 each 1  . DULoxetine (CYMBALTA) 30 MG capsule Take one tablet daily x1-2 weeks, then increase to two tablets daily if tolerating. 180 capsule 1   No facility-administered medications prior to visit.    Radiology:   No results found.  Cardiac Studies:    EKG:     EKG 06/26/2020: Normal sinus rhythm with rate of 62 beats minute, normal axis.  Incomplete right bundle branch block.  No evidence  of ischemia otherwise normal EKG.   Assessment     ICD-10-CM   1. Palpitations  R00.2 EKG 12-Lead  2. Tachycardia-bradycardia syndrome ([place])  I49.5   3. Dizziness and giddiness  R42      Medications Discontinued During This Encounter  Medication Reason  . DULoxetine (CYMBALTA) 30 MG capsule Allergic reaction    No orders of the defined types were placed in this encounter.   Recommendations:   [name] is a 44 y.o. Caucasian female with longstanding history of mild orthostatic hypotension and chronic palpitations, symptoms controlled with good hydration and excess salt intake, chronic migraine with aura who I had last seen in 2019 presents to reestablish care.  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  Her physical examination is normal, in 2012 she has had a normal echocardiogram and a routine treadmill stress test and also tilt table test.  I do not think she needs any of this for now, I would like to emperically try Vit B1 (Thiamine) 50 mg, Vit B6 (Pyrodoxine) 50 mg and Vit B12 (rappid release) 102mcg, twice daily for palpitations and tachycardia.   If symptoms do not improve then I will consider probably an echocardiogram and also an event monitor and consider addition of acebutolol.  Office visit in 6 weeks.    Adrian Prows, MD, [facility] 06/26/2020, [place] PM Office: [phone]

## 2020-06-28 LAB — ANA W/REFLEX: Anti Nuclear Antibody (ANA): NEGATIVE

## 2020-06-28 LAB — ALPHA-GAL PANEL
Alpha Gal IgE*: 0.89 kU/L — ABNORMAL HIGH (ref <0.10)
Beef (Bos spp) IgE: 0.39 kU/L — ABNORMAL HIGH (ref <0.35)
Class Interpretation: 1
Lamb/Mutton (Ovis spp) IgE: 0.1 kU/L (ref <0.35)
Pork (Sus spp) IgE: 0.15 kU/L (ref <0.35)

## 2020-06-28 LAB — IGE MILK W/ COMPONENT REFLEX: F002-IgE Milk: 0.18 kU/L — AB

## 2020-06-28 LAB — RHEUMATOID FACTOR: Rheumatoid fact SerPl-aCnc: <10 IU/mL (ref 0.0–13.9)

## 2020-06-28 LAB — C-REACTIVE PROTEIN: CRP: <1 mg/L (ref 0–10)

## 2020-06-28 LAB — ALLERGEN HYMENOPTERA PANEL
Bumblebee: 0.1 kU/L — AB
Honeybee IgE: 0.23 kU/L — AB
Hornet, White Face, IgE: 0.1 kU/L — AB
Hornet, Yellow, IgE: <0.1 kU/L
Paper Wasp IgE: 0.22 kU/L — AB
Yellow Jacket, IgE: 0.26 kU/L — AB

## 2020-06-28 LAB — CYCLIC CITRUL PEPTIDE ANTIBODY, IGG/IGA: Cyclic Citrullin Peptide Ab: 7 units (ref 0–19)

## 2020-06-28 LAB — TRYPTASE: Tryptase: 5.6 ug/L (ref 2.2–13.2)

## 2020-07-05 ENCOUNTER — Encounter: Payer: Self-pay | Admitting: Allergy and Immunology

## 2020-07-06 ENCOUNTER — Other Ambulatory Visit: Payer: Self-pay | Admitting: Medical

## 2020-07-06 DIAGNOSIS — T7840XA Allergy, unspecified, initial encounter: Secondary | ICD-10-CM

## 2020-07-09 ENCOUNTER — Other Ambulatory Visit: Payer: Managed Care, Other (non HMO)

## 2020-07-09 ENCOUNTER — Telehealth: Payer: Self-pay

## 2020-07-09 ENCOUNTER — Other Ambulatory Visit: Payer: Self-pay

## 2020-07-09 DIAGNOSIS — Z20822 Contact with and (suspected) exposure to covid-19: Secondary | ICD-10-CM

## 2020-07-09 NOTE — Telephone Encounter (Signed)
Okay, please let patient know that this reaction has been logged into epic.  Modern day flu vaccines have very little egg content and she may actually have received one of the flu vaccine that has 0 egg content.  Even people who are allergic to eggs can still get the flu vaccine without any problem.  So I doubt that the issue will be an egg allergy.

## 2020-07-09 NOTE — Telephone Encounter (Signed)
Called patient to give her blood test results.  She wanted to let Dr. Neldon Mc know that she did get the Flu injection last week and had body swelling about 30 to 40 minutes after receiving the injection.  She did have doctor's office order blood test for egg allergy and this should result in Epic later this week. She is not interested in venom injections at this time, so I did not schedule her for the venom clinic.  I told her to let us know if she would like to proceed with venom clinic in the future. Patient wanted to know if Dr. Neldon Mc wanted to see her back for return visit.  Please advise.

## 2020-07-09 NOTE — Telephone Encounter (Signed)
Sent MyChart message to patient informing her of Dr. Bruna Potter message.  I also informed her that Dr. Neldon Mc would like to see her back in 6 months or earlier if she has recurrent reactions.

## 2020-07-10 LAB — NOVEL CORONAVIRUS, NAA: SARS-CoV-2, NAA: NOT DETECTED

## 2020-07-10 LAB — IGE EGG WHITE W/COMPONENT RFLX: F001-IgE Egg White: <0.1 kU/L

## 2020-07-10 LAB — SARS-COV-2, NAA 2 DAY TAT

## 2020-07-17 ENCOUNTER — Other Ambulatory Visit: Payer: Self-pay

## 2020-07-17 MED ORDER — ACYCLOVIR 5 % EX CREA: 1.0000 "application " | TOPICAL_CREAM | CUTANEOUS | 0 refills | Status: DC

## 2020-07-19 ENCOUNTER — Other Ambulatory Visit: Payer: Self-pay

## 2020-07-19 MED ORDER — ACYCLOVIR 5 % EX OINT: 1.0000 "application " | TOPICAL_OINTMENT | CUTANEOUS | 0 refills | Status: DC | PRN

## 2020-07-20 ENCOUNTER — Other Ambulatory Visit: Payer: Self-pay | Admitting: Advanced Practice Midwife

## 2020-07-20 MED ORDER — FLUCONAZOLE 150 MG PO TABS: 150.0000 mg | ORAL_TABLET | Freq: Once | ORAL | 0 refills | Status: AC

## 2020-07-20 NOTE — Progress Notes (Signed)
Patient called and reports that she is on an antibiotic for bronchitis and is now having a yeast infection. She cannot find an OTC med that does not have gelatin in it and she is allergic to gelatin. She would like a RX for Diflucan. RX sent in to patient's pharmacy.   Marcille Buffy DNP, CNM  07/20/20  8:21 AM

## 2020-08-06 ENCOUNTER — Ambulatory Visit: Payer: Managed Care, Other (non HMO) | Admitting: Cardiology

## 2020-09-05 ENCOUNTER — Ambulatory Visit: Payer: Managed Care, Other (non HMO) | Admitting: Cardiology

## 2020-09-11 ENCOUNTER — Other Ambulatory Visit: Payer: Self-pay | Admitting: Internal Medicine

## 2020-09-11 DIAGNOSIS — F411 Generalized anxiety disorder: Secondary | ICD-10-CM

## 2020-09-11 MED ORDER — ALPRAZOLAM 0.25 MG PO TABS: 0.2500 mg | ORAL_TABLET | Freq: Three times a day (TID) | ORAL | 2 refills | Status: DC | PRN

## 2020-09-11 MED ORDER — EPINEPHRINE 0.3 MG/0.3ML IJ SOAJ: 0.3000 mg | INTRAMUSCULAR | 1 refills | Status: DC | PRN

## 2020-09-12 MED FILL — ALPRAZolam 0.25 MG TABS: 0.25 | 30 days supply | Qty: 90 | Fill #0

## 2020-09-26 ENCOUNTER — Telehealth: Payer: Self-pay | Admitting: Allergy and Immunology

## 2020-09-26 NOTE — Progress Notes (Signed)
Primary Physician/Referring:  Nicolette Bang, DO  Patient ID: [name], female    DOB: [birth year], 44 y.o.   MRN: [id number]  Chief Complaint  Patient presents with  . Palpitations  . Follow-up   HPI:    [name]  is a 44 y.o. Caucasian female with longstanding history of mild orthostatic hypotension and chronic palpitations, symptoms controlled with good hydration and excess salt intake, chronic migraine with aura.  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  Patient was last seen in our office by Dr. Einar Gip on 06/26/2020, at which time she was advised to follow up in 6 weeks however she was lost to follow up. She now presents for follow up palpitations and tachycardia, as well as mild orthostatic hypotension. At last visit started Vit B1, Vit B6, and Vit B12 for palpitations and tachycardia.  However patient has been unable to find vitamin B1 that she can tolerate due to her allergies.  Also she has only been consistently taking vitamin B6 and vitamin B12 once daily.  Patient reports symptoms of palpitations are unchanged from previous visit.  She reports heart racing symptoms 3-4 times per week which last 1-2 minutes each time, and are occasionally associated with lightheadedness.  Patient also reports 1 episode of syncope 4 weeks ago.  Reports prodromal symptoms including lightheadedness, dizziness, and vision changes.  Denies postictal state.  She does report that she experienced palpitations following this event. Patient also reports occasional brief episodes of non-exertional chest pain without associated symptoms. States these episodes tend to be associated with increased stress.    Past  Medical History:  Diagnosis Date  . Anxiety and depression   . Chronic endometritis   . Syncope    Past Surgical History:  Procedure Laterality Date  . CESAREAN SECTION CLASSICAL    . PARTIAL NEPHRECTOMY Left   . TUBAL LIGATION     Family History  Problem Relation Age of Onset  . Skin cancer Mother   . Hyperlipidemia Father   . Hyperlipidemia Brother   . Hypertension Maternal Grandmother   . Depression Maternal Aunt     Social History   Tobacco Use  . Smoking status: Former Smoker    Packs/day: 0.25    Types: Cigarettes  . Smokeless tobacco: Never Used  . Tobacco comment: "smoke occasionally when younger"  Substance Use Topics  . Alcohol use: Yes   Marital Status: Married  ROS  Review of Systems  Constitutional: Negative for malaise/fatigue and weight gain.  Cardiovascular: Positive for chest pain, irregular heartbeat and syncope. Negative for claudication, dyspnea on exertion, leg swelling, near-syncope, orthopnea and paroxysmal nocturnal dyspnea.  Respiratory: Negative for shortness of breath.   Hematologic/Lymphatic: Does not bruise/bleed easily.  Gastrointestinal: Negative for melena.  Neurological: Positive for dizziness and light-headedness. Negative for weakness.   Objective  Blood pressure 109/71, pulse 65, height 5\' 7"  (1.702 m), weight 125 lb (56.7 kg), SpO2 98 %.  Vitals with BMI 09/27/2020 06/26/2020 06/21/2020  Height 5\' 7"  5\' 7"  5\' 7"   Weight 125 lbs 130 lbs 129 lbs 6 oz  BMI 19.57 [id number]  Systolic 0000000 A999333 A999333  Diastolic 71 64 68  Pulse 65 77 64   Orthostatic  VS for the past 72 hrs (Last 3 readings):  Patient Position BP Location Cuff Size  09/27/20 1304 Sitting Left Arm Normal      Physical Exam Vitals reviewed. Nursing note reviewed: petite.  HENT:     Head: Normocephalic and atraumatic.  Cardiovascular:     Rate and Rhythm: Normal rate and regular rhythm.     Pulses: Intact distal pulses.     Heart sounds: Normal heart sounds, S1  normal and S2 normal. No murmur heard. No gallop.      Comments: No leg edema, no JVD. Pulmonary:     Effort: Pulmonary effort is normal. No respiratory distress.     Breath sounds: Normal breath sounds. No wheezing, rhonchi or rales.  Abdominal:     General: Bowel sounds are normal.     Palpations: Abdomen is soft.  Musculoskeletal:     Right lower leg: No edema.     Left lower leg: No edema.  Neurological:     Mental Status: She is alert.    Laboratory examination:   Recent Labs    04/17/20 1659  NA 141  K 3.8  CL 104  CO2 21  GLUCOSE 75  BUN 14  CREATININE 0.71  CALCIUM 8.7  GFRNONAA 105  GFRAA 121   CrCl cannot be calculated (Patient's most recent lab result is older than the maximum 21 days allowed.).  CMP Latest Ref Rng & Units 04/17/2020 03/06/2020 07/22/2018  Glucose 65 - 99 mg/dL 75 - 91  BUN 6 - 24 mg/dL 14 - 14  Creatinine 0.57 - 1.00 mg/dL 0.71 - 0.54  Sodium 134 - 144 mmol/L 141 - 142  Potassium 3.5 - 5.2 mmol/L 3.8 - 4.0  Chloride 96 - 106 mmol/L 104 - 106  CO2 20 - 29 mmol/L 21 - 28  Calcium 8.7 - 10.2 mg/dL 8.7 - 8.8(L)  Total Protein 6.0 - 8.5 g/dL 6.8 7.2 7.0  Total Bilirubin 0.0 - 1.2 mg/dL 0.8 1.3(H) 0.8  Alkaline Phos 48 - 121 IU/L 52 48 34(L)  AST 0 - 40 IU/L 15 16 18   ALT 0 - 32 IU/L 5 8 12    CBC Latest Ref Rng & Units 04/17/2020 03/06/2020 07/22/2018  WBC 3.4 - 10.8 x10E3/uL 8.3 7.3 5.6  Hemoglobin 11.1 - 15.9 g/dL 13.4 15.2 12.7  Hematocrit 34.0 - 46.6 % 39.8 45.4 38.4  Platelets 150 - 450 x10E3/uL 291 268 243    Lipid Panel Recent Labs    04/17/20 1659 04/30/20 0923  CHOL 161 177  TRIG 198* 75  LDLCALC 82 112*  HDL 46 51    HEMOGLOBIN A1C Lab Results  Component Value Date   HGBA1C 5.1 04/17/2020   TSH Recent Labs    03/06/20 0938  TSH 1.400    Medications and allergies   Allergies  Allergen Reactions  . Bee Venom Itching, Shortness Of Breath and Swelling  . Beef-Derived Products     Severe stomach cramping,  diarrhea  . Buspirone Diarrhea and Other (See Comments)  . Codeine Nausea And Vomiting  . Macrobid [Nitrofurantoin Macrocrystal] Itching  . Magnesium Nausea And Vomiting  . Meat [Alpha-Gal]   . Nitrofurantoin Other (See Comments)    Severe yeast infection  . Prednisone      Outpatient Medications Prior to Visit  Medication Sig Dispense Refill  . acyclovir cream (ZOVIRAX) 5 % Apply 1 application topically every 3 (three) hours. 15 g 0  . acyclovir ointment (ZOVIRAX) 5 % Apply 1 application topically  every 4 (four) hours as needed. 30 g 0  . albuterol (VENTOLIN HFA) 108 (90 Base) MCG/ACT inhaler as needed.    . ALPRAZolam (XANAX) 0.25 MG tablet Take 1 tablet (0.25 mg total) by mouth 3 (three) times daily as needed. 90 tablet 2  . EPINEPHrine 0.3 mg/0.3 mL IJ SOAJ injection Inject 0.3 mg into the muscle as needed for anaphylaxis. 2 each 1  . SYMBICORT 160-4.5 MCG/ACT inhaler Inhale 2 puffs into the lungs 2 (two) times daily. (Patient not taking: Reported on 09/27/2020)     No facility-administered medications prior to visit.    Radiology:   No results found.  Cardiac Studies:   None available   EKG:    EKG 09/27/2020: Sinus rhythm at a rate of 61 bpm.  Normal axis.  Incomplete right bundle branch block.  No evidence of ischemia or injury pattern.  Compared to EKG 06/26/2020, no significant change.   EKG 06/26/2020: Normal sinus rhythm with rate of 62 beats minute, normal axis.  Incomplete right bundle branch block.  No evidence of ischemia otherwise normal EKG.    Assessment     ICD-10-CM   1. Palpitations  R00.2 PCV ECHOCARDIOGRAM COMPLETE    LONG TERM MONITOR (3-14 DAYS)    PCV CARDIAC STRESS TEST    EKG 12-Lead    Novel Coronavirus, NAA ([facility])  2. Tachycardia-bradycardia syndrome (HCC)  I49.5 PCV ECHOCARDIOGRAM COMPLETE    LONG TERM MONITOR (3-14 DAYS)  3. Precordial pain  R07.2   4. Orthostatic hypotension  I95.1      Medications Discontinued During This  Encounter  Medication Reason  . SYMBICORT 160-4.5 MCG/ACT inhaler Completed Course    No orders of the defined types were placed in this encounter.   Recommendations:   Fatina L Hole is a 44 y.o. Caucasian female with longstanding history of mild orthostatic hypotension and chronic palpitations, symptoms controlled with good hydration and excess salt intake, chronic migraine with aura.  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  Patient presents for 27-month follow-up of palpitations.  Patient symptoms of palpitations have not improved since her visit in September 2021, although patient has not consistently been compliant with taking vitamin B1, vitamin B6, vitamin B12.  Patient also now reports a single episode of syncope, suggestive of vasovagal etiology.  Advised patient to continue with liberal hydration and salt intake.  She also now reports new atypical chest pain, particularly associated with stress.  As patient's palpitations have not improved and she now reports an episode of syncope and chest pain recommend further cardiovascular evaluation at this time. Will obtain cardiac monitor, echocardiogram, and exercise stress test. Discussed with patient exercise stress test vs nuclear stress test as she is without significant cardiovascular risk factors and symptoms are atypical. Shared decision was to proceed with exercise stress testing. Also discussed with patient the option of starting acebutolol for palpitations, shared decision was to wait until further cardiac testing was complete to reevaluate medical management.   Follow up in 6 weeks.    Lisa Halsted, PA-C 09/27/2020, 5:46 PM Office: [phone]

## 2020-09-26 NOTE — Telephone Encounter (Signed)
[name] called in and states she has Alpha-gal.  [name] states her Alpha-gal issue is now progressed to swelling and hives.  [name] states on Christmas Eve, her brother, Risa Grill put whipped cream on her face and she started to itch and swell.  [name] wanted to know if there are any treatments or anything that could help with Alpha-gal.  [name] states a friend of hers suggested acupuncture and didn't know if that would work.  Patient states she really wants to eat a bacon cheeseburger.  Please advise.

## 2020-09-27 ENCOUNTER — Other Ambulatory Visit: Payer: Self-pay

## 2020-09-27 ENCOUNTER — Encounter: Payer: Self-pay | Admitting: Student

## 2020-09-27 ENCOUNTER — Ambulatory Visit: Payer: Managed Care, Other (non HMO) | Admitting: Student

## 2020-09-27 VITALS — BP 109/71 | HR 65 | Ht 67.0 in | Wt 125.0 lb

## 2020-09-27 DIAGNOSIS — R002 Palpitations: Secondary | ICD-10-CM | POA: Diagnosis not present

## 2020-09-27 DIAGNOSIS — I495 Sick sinus syndrome: Secondary | ICD-10-CM

## 2020-09-27 DIAGNOSIS — I951 Orthostatic hypotension: Secondary | ICD-10-CM | POA: Diagnosis not present

## 2020-09-27 DIAGNOSIS — R072 Precordial pain: Secondary | ICD-10-CM

## 2020-09-27 NOTE — Telephone Encounter (Signed)
Left a message for Britnie to call back.

## 2020-09-27 NOTE — Telephone Encounter (Signed)
Please inform [name] that there is no therapy at this point in time other than avoidance measures regarding alpha gal or her dairy allergy.

## 2020-09-27 NOTE — Telephone Encounter (Signed)
Patient informed. 

## 2020-10-15 ENCOUNTER — Other Ambulatory Visit: Payer: Self-pay

## 2020-10-17 DIAGNOSIS — Z20822 Contact with and (suspected) exposure to covid-19: Secondary | ICD-10-CM | POA: Diagnosis not present

## 2020-10-19 ENCOUNTER — Other Ambulatory Visit: Payer: Self-pay

## 2020-10-24 DIAGNOSIS — J101 Influenza due to other identified influenza virus with other respiratory manifestations: Secondary | ICD-10-CM | POA: Diagnosis not present

## 2020-10-24 DIAGNOSIS — B349 Viral infection, unspecified: Secondary | ICD-10-CM | POA: Diagnosis not present

## 2020-10-24 DIAGNOSIS — J069 Acute upper respiratory infection, unspecified: Secondary | ICD-10-CM | POA: Diagnosis not present

## 2020-10-24 DIAGNOSIS — J029 Acute pharyngitis, unspecified: Secondary | ICD-10-CM | POA: Diagnosis not present

## 2020-10-24 DIAGNOSIS — J9801 Acute bronchospasm: Secondary | ICD-10-CM | POA: Diagnosis not present

## 2020-10-29 ENCOUNTER — Inpatient Hospital Stay: Payer: 59

## 2020-10-29 ENCOUNTER — Ambulatory Visit: Payer: 59

## 2020-10-29 ENCOUNTER — Other Ambulatory Visit: Payer: Self-pay

## 2020-10-29 DIAGNOSIS — R002 Palpitations: Secondary | ICD-10-CM

## 2020-10-29 DIAGNOSIS — I495 Sick sinus syndrome: Secondary | ICD-10-CM

## 2020-10-30 NOTE — Progress Notes (Signed)
Please inform patient her echo and stress test are both normal.

## 2020-11-01 NOTE — Progress Notes (Signed)
Called pt, no answer. Left vm requesting call back?

## 2020-11-01 NOTE — Telephone Encounter (Signed)
From patient.

## 2020-11-06 NOTE — Progress Notes (Signed)
Attempted to call pt, no answer. Left vm requesting call back.

## 2020-11-06 NOTE — Progress Notes (Signed)
Called and spoke to pt regarding echo and stress test results. Pt voiced understanding.

## 2020-11-09 ENCOUNTER — Ambulatory Visit: Payer: Self-pay | Admitting: Student

## 2020-11-21 DIAGNOSIS — R002 Palpitations: Secondary | ICD-10-CM | POA: Diagnosis not present

## 2020-11-21 DIAGNOSIS — I495 Sick sinus syndrome: Secondary | ICD-10-CM | POA: Diagnosis not present

## 2020-11-28 ENCOUNTER — Ambulatory Visit: Payer: Self-pay | Admitting: Cardiology

## 2020-11-28 DIAGNOSIS — I495 Sick sinus syndrome: Secondary | ICD-10-CM | POA: Diagnosis not present

## 2020-11-28 DIAGNOSIS — R002 Palpitations: Secondary | ICD-10-CM | POA: Diagnosis not present

## 2020-11-29 ENCOUNTER — Other Ambulatory Visit: Payer: Self-pay

## 2020-11-29 ENCOUNTER — Ambulatory Visit: Payer: 59 | Admitting: Cardiology

## 2020-11-29 ENCOUNTER — Encounter: Payer: Self-pay | Admitting: Cardiology

## 2020-11-29 VITALS — BP 116/78 | HR 96 | Temp 97.8°F | Resp 16 | Ht 67.0 in | Wt 120.6 lb

## 2020-11-29 DIAGNOSIS — R002 Palpitations: Secondary | ICD-10-CM

## 2020-11-29 DIAGNOSIS — R42 Dizziness and giddiness: Secondary | ICD-10-CM | POA: Diagnosis not present

## 2020-11-29 NOTE — Progress Notes (Signed)
Primary Physician/Referring:  Nicolette Bang, DO  Patient ID: [name], female    DOB: April 08, 1976, 45 y.o.   MRN: [id number]  Chief Complaint  Patient presents with  . Palpitations  . Follow-up    6 weeks   HPI:    [name]  is a 45 y.o. Caucasian female with longstanding history of mild orthostatic hypotension and chronic palpitations, symptoms controlled with good hydration and excess salt intake, chronic migraine with aura.  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  Patient presents for 55-month follow-up of palpitations and dizziness.  She has been taking B6 and B12 supplements, in view of multiple allergies has not been able to procure B1.  She continues to have brief episodes of palpitations mostly at rest, and again has dizzy spells when she suddenly stands up or bends down.  She has not had any syncope.  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  Past Medical History:  Diagnosis Date  . Anxiety and depression   . Chronic endometritis   . Syncope    Past Surgical History:  Procedure Laterality Date  . CESAREAN SECTION CLASSICAL    . PARTIAL NEPHRECTOMY Left   . TUBAL LIGATION     Family History  Problem Relation Age of Onset  . Skin cancer Mother   . Hyperlipidemia Father   . Hyperlipidemia Brother   . Hypertension Maternal Grandmother   . Depression Maternal Aunt     Social History   Tobacco Use  . Smoking status: Former Smoker    Packs/day: 0.25    Types: Cigarettes  . Smokeless tobacco: Never Used  . Tobacco comment: "smoke occasionally when younger"  Substance Use Topics  . Alcohol use: Yes    Alcohol/week: 1.0 standard  drink    Types: 1 Glasses of wine per week    Comment: rarely   Marital Status: Married   ROS  Review of Systems  Constitutional: Negative for malaise/fatigue and weight gain.  Cardiovascular: Positive for irregular heartbeat and syncope. Negative for chest pain, claudication, dyspnea on exertion and paroxysmal nocturnal dyspnea.  Respiratory: Negative for shortness of breath.   Hematologic/Lymphatic: Does not bruise/bleed easily.  Gastrointestinal: Negative for melena.  Neurological: Positive for dizziness and light-headedness. Negative for weakness.   Objective  Blood pressure 116/78, pulse 96, temperature 97.8 F (36.6 C), temperature source Temporal, resp. rate 16, height 5\' 7"  (1.702 m), weight 120 lb 9.6 oz (54.7 kg), SpO2 98 %.  Vitals with BMI 11/29/2020 09/27/2020 06/26/2020  Height 5\' 7"  5\' 7"  5\' 7"   Weight 120 lbs 10 oz 125 lbs 130 lbs  BMI 18.88 13.24 40.10  Systolic 272 536 644  Diastolic 78 71 64  Pulse 96 65 77   Orthostatic VS for the past 72 hrs (Last 3 readings):  Patient Position BP Location Cuff Size  11/29/20 1142 Sitting Left Arm Normal      Physical Exam Vitals reviewed. Nursing note reviewed: petite.  HENT:     Head: Normocephalic and atraumatic.  Cardiovascular:     Rate and Rhythm: Normal rate and regular rhythm.     Pulses: Intact distal pulses.     Heart sounds: Normal  heart sounds, S1 normal and S2 normal. No murmur heard. No gallop.      Comments: No leg edema, no JVD. Pulmonary:     Effort: Pulmonary effort is normal. No respiratory distress.     Breath sounds: Normal breath sounds. No wheezing, rhonchi or rales.  Abdominal:     General: Bowel sounds are normal.     Palpations: Abdomen is soft.  Musculoskeletal:     Right lower leg: No edema.     Left lower leg: No edema.  Neurological:     Mental Status: She is alert.    Laboratory examination:   Recent Labs    04/17/20 1659  NA 141  K 3.8  CL 104  CO2 21  GLUCOSE 75  BUN 14   CREATININE 0.71  CALCIUM 8.7  GFRNONAA 105  GFRAA 121   CrCl cannot be calculated (Patient's most recent lab result is older than the maximum 21 days allowed.).  CMP Latest Ref Rng & Units 04/17/2020 03/06/2020 07/22/2018  Glucose 65 - 99 mg/dL 75 - 91  BUN 6 - 24 mg/dL 14 - 14  Creatinine 0.57 - 1.00 mg/dL 0.71 - 0.54  Sodium 134 - 144 mmol/L 141 - 142  Potassium 3.5 - 5.2 mmol/L 3.8 - 4.0  Chloride 96 - 106 mmol/L 104 - 106  CO2 20 - 29 mmol/L 21 - 28  Calcium 8.7 - 10.2 mg/dL 8.7 - 8.8(L)  Total Protein 6.0 - 8.5 g/dL 6.8 7.2 7.0  Total Bilirubin 0.0 - 1.2 mg/dL 0.8 1.3(H) 0.8  Alkaline Phos 48 - 121 IU/L 52 48 34(L)  AST 0 - 40 IU/L 15 16 18   ALT 0 - 32 IU/L 5 8 12    CBC Latest Ref Rng & Units 04/17/2020 03/06/2020 07/22/2018  WBC 3.4 - 10.8 x10E3/uL 8.3 7.3 5.6  Hemoglobin 11.1 - 15.9 g/dL 13.4 15.2 12.7  Hematocrit 34.0 - 46.6 % 39.8 45.4 38.4  Platelets 150 - 450 x10E3/uL 291 268 243   Lipid Panel Recent Labs    04/17/20 1659 04/30/20 0923  CHOL 161 177  TRIG 198* 75  LDLCALC 82 112*  HDL 46 51   HEMOGLOBIN A1C Lab Results  Component Value Date   HGBA1C 5.1 04/17/2020   TSH Recent Labs    03/06/20 0938  TSH 1.400   Medications and allergies   Allergies  Allergen Reactions  . Bee Venom Itching, Shortness Of Breath and Swelling  . Beef-Derived Products     Severe stomach cramping, diarrhea  . Buspirone Diarrhea and Other (See Comments)  . Codeine Nausea And Vomiting  . Macrobid [Nitrofurantoin Macrocrystal] Itching  . Magnesium Nausea And Vomiting  . Meat [Alpha-Gal]   . Nitrofurantoin Other (See Comments)    Severe yeast infection  . Prednisone     Outpatient Medications Prior to Visit  Medication Sig Dispense Refill  . acyclovir ointment (ZOVIRAX) 5 % Apply 1 application topically every 4 (four) hours as needed. 30 g 0  . albuterol (VENTOLIN HFA) 108 (90 Base) MCG/ACT inhaler as needed.    . ALPRAZolam (XANAX) 0.25 MG tablet Take 1 tablet (0.25  mg total) by mouth 3 (three) times daily as needed. 90 tablet 2  . budesonide-formoterol (SYMBICORT) 160-4.5 MCG/ACT inhaler Inhale 1-2 puffs into the lungs daily.    . cetirizine (ZYRTEC) 10 MG tablet Take 10 mg by mouth daily.    Marland Kitchen EPINEPHrine 0.3 mg/0.3 mL IJ SOAJ injection Inject 0.3 mg into the muscle as needed for anaphylaxis.  2 each 1  . acyclovir cream (ZOVIRAX) 5 % Apply 1 application topically every 3 (three) hours. 15 g 0   No facility-administered medications prior to visit.   Radiology:   No results found.  Cardiac Studies:   Ambulatory cardiac telemetry 10/29/2020-11/11/2020: Predominant underlying rhythm was sinus with minimum heart rate 39 bpm, maximum heart rate 139 bpm, average heart rate 79 bpm.  Should symptoms correlated with sinus rhythm and PVC.  Rare PACs and PVCs.  2 episodes of supraventricular tachycardia longest lasting 4.5 seconds with maximum heart rate of 179 bpm.  No evidence of ventricular tachycardia, atrial fibrillation, high degree AV block, pauses >3 seconds.   Echocardiogram 10/29/2020: Left ventricle cavity is normal in size and wall thickness. Normal global wall motion. Normal LV systolic function with visual EF 55-60%. Normal diastolic filling pattern.  No significant valvular abnormalities. No evidence of pulmonary hypertension.   Exercise treadmill stress test 10/29/2020: Exercise treadmill stress test performed using Bruce protocol.  Patient reached 11.1 METS, and 98% of age predicted maximum heart rate.  Exercise capacity was excellent.  Non-limiting chest pain reported.  Normal heart rate and hemodynamic response. Stress EKG revealed no ischemic changes. Low risk study.    EKG:     EKG 09/27/2020: Sinus rhythm at a rate of 61 bpm.  Normal axis.  Incomplete right bundle branch block.  No evidence of ischemia or injury pattern.  Compared to EKG 06/26/2020, no significant change.   EKG 06/26/2020: Normal sinus rhythm with rate of 62 beats  minute, normal axis.  Incomplete right bundle branch block.  No evidence of ischemia otherwise normal EKG.    Assessment     ICD-10-CM   1. Palpitations  R00.2   2. Dizziness and giddiness  R42      Medications Discontinued During This Encounter  Medication Reason  . acyclovir cream (ZOVIRAX) 5 % Error    No orders of the defined types were placed in this encounter.  Recommendations:   Leiyah L Trick is a 46 y.o. Caucasian female with longstanding history of mild orthostatic hypotension and chronic palpitations, symptoms controlled with good hydration and excess salt intake, chronic migraine with aura.  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  Patient presents for 82-month follow-up of palpitations and dizziness.  She has been taking B6 and B12 supplements, in view of multiple allergies has not been able to procure B1.  Overall symptoms are stable, she has not had any syncope, she has not had any unexplained syncope or dizziness.  Most of the episodes are occurring with positional change especially when she suddenly stands up.  I reviewed the results of the echocardiogram and stress test and reassured her.  With regard to event monitor, she did have a brief PSVT episode, we discussed unless these are persistent then will do further work-up otherwise watchful waiting.  Counterpressure maneuver discussed with the patient.  I will see her back on a as needed basis.     Adrian Prows, PA-C 11/29/2020, 12:30 PM Office: [phone]

## 2020-12-27 DIAGNOSIS — Z20822 Contact with and (suspected) exposure to covid-19: Secondary | ICD-10-CM | POA: Diagnosis not present

## 2020-12-31 DIAGNOSIS — H524 Presbyopia: Secondary | ICD-10-CM | POA: Diagnosis not present

## 2021-02-14 DIAGNOSIS — T7840XA Allergy, unspecified, initial encounter: Secondary | ICD-10-CM | POA: Diagnosis not present

## 2021-03-01 ENCOUNTER — Encounter (INDEPENDENT_AMBULATORY_CARE_PROVIDER_SITE_OTHER): Payer: Self-pay

## 2021-03-01 ENCOUNTER — Other Ambulatory Visit (HOSPITAL_COMMUNITY): Payer: Self-pay

## 2021-03-01 DIAGNOSIS — Z20822 Contact with and (suspected) exposure to covid-19: Secondary | ICD-10-CM | POA: Diagnosis not present

## 2021-03-01 MED ORDER — CARESTART COVID-19 HOME TEST VI KIT
PACK | 0 refills | Status: DC
Start: 2021-03-01 — End: 2021-08-19
  Filled 2021-03-01: qty 4, 4d supply, fill #0

## 2021-04-03 DIAGNOSIS — I951 Orthostatic hypotension: Secondary | ICD-10-CM | POA: Diagnosis not present

## 2021-04-03 DIAGNOSIS — F419 Anxiety disorder, unspecified: Secondary | ICD-10-CM | POA: Diagnosis not present

## 2021-04-03 DIAGNOSIS — N181 Chronic kidney disease, stage 1: Secondary | ICD-10-CM | POA: Diagnosis not present

## 2021-04-03 DIAGNOSIS — T781XXA Other adverse food reactions, not elsewhere classified, initial encounter: Secondary | ICD-10-CM | POA: Diagnosis not present

## 2021-04-03 DIAGNOSIS — Z7689 Persons encountering health services in other specified circumstances: Secondary | ICD-10-CM | POA: Diagnosis not present

## 2021-04-03 DIAGNOSIS — Z87442 Personal history of urinary calculi: Secondary | ICD-10-CM | POA: Diagnosis not present

## 2021-05-30 ENCOUNTER — Other Ambulatory Visit: Payer: Self-pay | Admitting: General Practice

## 2021-06-19 ENCOUNTER — Encounter: Payer: Self-pay | Admitting: General Practice

## 2021-06-25 ENCOUNTER — Ambulatory Visit (INDEPENDENT_AMBULATORY_CARE_PROVIDER_SITE_OTHER): Payer: 59 | Admitting: Internal Medicine

## 2021-06-25 ENCOUNTER — Encounter: Payer: Self-pay | Admitting: Internal Medicine

## 2021-06-25 VITALS — BP 100/70 | HR 105 | Ht 67.0 in | Wt 129.0 lb

## 2021-06-25 DIAGNOSIS — R1013 Epigastric pain: Secondary | ICD-10-CM

## 2021-06-25 DIAGNOSIS — R10816 Epigastric abdominal tenderness: Secondary | ICD-10-CM

## 2021-06-25 NOTE — Patient Instructions (Signed)
You will be contacted by [facility] in the next 2 days to arrange a complete abdominal U/S.  The number on your caller ID will be [phone], please answer when they call.  If you have not heard from them in 2 days please call [phone] to schedule.    Your provider has requested that you go to the basement level for lab work before leaving today. Press "B" on the elevator. The lab is located at the first door on the left as you exit the elevator.  Due to recent changes in healthcare laws, you may see the results of your imaging and laboratory studies on MyChart before your provider has had a chance to review them.  We understand that in some cases there may be results that are confusing or concerning to you. Not all laboratory results come back in the same time frame and the provider may be waiting for multiple results in order to interpret others.  Please give [place] 48 hours in order for your provider to thoroughly review all the results before contacting the office for clarification of your results.   If you are age 69 or older, your body mass index should be between 23-30. Your Body mass index is 20.2 kg/m. If this is out of the aforementioned range listed, please consider follow up with your Primary Care Provider.  If you are age 54 or younger, your body mass index should be between 19-25. Your Body mass index is 20.2 kg/m. If this is out of the aformentioned range listed, please consider follow up with your Primary Care Provider.   __________________________________________________________  The [facility] providers would like to encourage you to use [facility] to communicate with providers for non-urgent requests or questions.  Due to long hold times on the telephone, sending your provider a message by [facility] may be a faster and more efficient way to get a response.  Please allow 48 business hours for a response.  Please remember that this is for non-urgent requests.   I  appreciate the opportunity to care for you. Silvano Rusk, MD, [facility]

## 2021-06-25 NOTE — Progress Notes (Signed)
[name] 45 y.o. 03/21/1976 [id number]  Assessment & Plan:   Encounter Diagnoses  Name Primary?   Abdominal pain, epigastric Yes   Epigastric abdominal tenderness without rebound tenderness    The clinical scenario suggests biliary problems i.e. gallstones plus or minus pancreatitis.  She is improved.  Work-up as below.  Further plans pending that. Orders Placed This Encounter  Procedures   US Abdomen Complete   CBC with Differential/Platelet   Comprehensive metabolic panel   Amylase   Lipase       Subjective:   Chief Complaint: Abdominal pain  HPI 45 year old woman from [facility] here with complaints of severe abdominal pain constipation and bloating.  Past medical history notable for alpha gal allergy, bee sting allergy, food allergy?, partial nephrectomy tubal ligation and cesarean section as well as chronic endometritis.  She was in her usual state of health but then on Saturday which is 4 days ago she developed severe epigastric pain after eating her breakfast of a hard-boiled egg.  2 hours after.  Pain radiating into the back.  She managed to work but was fairly miserable and has not been able to eat well.  She has been on chicken rice soup.  She is tolerating that.  She did vomit on Saturday as well.  No fever that she can recall but she did really feel unwell.  Husband wanted to take her to the emergency department but she did not want to so she wrote it out.  She has not had problems like this before.  Lots of food intolerance issues as outlined below.  She does have a history of headaches thought to be migraines.  She reports that the severity and frequency of these improved after visit line to correct dental misalignment.  She did not have migraine problems over the weekend when these problems began.  She had seen Dr. Lucie Leather of allergy last year and she had a reaction to a sting from the insect, described syndrome compatible with alpha gal  allergy and also had dairy issues with nausea and bloating with ice cream and cheese.  Was complaining of intermittent persistent nausea bloating and itchy red spots on her skin and throat feeling itchy treated with Zyrtec.  Also complained of sinus issues and joint pain.  Testing confirmed her hymenoptera allergy and alpha gal and also indicated that she had low levels of antibodies directed at milk and she was advised to avoid the foods and take precautions regarding bee stings.  She had seen Dr. Jacinto Halim in September 2021 for palpitations.  CT renal stone study October 2019 with 3 left kidney stones, intrarenal, moderate to severe loss of intervertebral disc space L5-S1.   Wt Readings from Last 3 Encounters:  06/25/21 129 lb (58.5 kg)  11/29/20 120 lb 9.6 oz (54.7 kg)  09/27/20 125 lb (56.7 kg)    Allergies  Allergen Reactions   Bee Venom Itching, Shortness Of Breath and Swelling   Influenza Vaccines Hives and Rash   Beef-Derived Products     Severe stomach cramping, diarrhea   Buspirone Diarrhea and Other (See Comments)   Codeine Nausea And Vomiting   Macrobid [Nitrofurantoin Macrocrystal] Itching   Magnesium Nausea And Vomiting   Meat [Alpha-Gal]    Nitrofurantoin Other (See Comments)    Severe yeast infection   Prednisone    Current Meds  Medication Sig   acyclovir ointment (ZOVIRAX) 5 % Apply 1 application topically every 4 (four) hours as needed.  albuterol (VENTOLIN HFA) 108 (90 Base) MCG/ACT inhaler as needed.   ALPRAZolam (XANAX) 0.25 MG tablet Take 1 tablet (0.25 mg total) by mouth 3 (three) times daily as needed.   budesonide-formoterol (SYMBICORT) 160-4.5 MCG/ACT inhaler Inhale 1-2 puffs into the lungs daily.   cetirizine (ZYRTEC) 10 MG tablet Take 10 mg by mouth daily.   COVID-19 At Home Antigen Test [facility] COVID-19 HOME TEST) KIT Use as directed   EPINEPHrine 0.3 mg/0.3 mL IJ SOAJ injection Inject 0.3 mg into the muscle as needed for anaphylaxis.   Past Medical  History:  Diagnosis Date   Allergy to alpha-gal    Anxiety and depression    Chronic endometritis    Migraine headache with aura    Syncope    TMJ (temporomandibular joint syndrome)    Past Surgical History:  Procedure Laterality Date   CESAREAN SECTION CLASSICAL     PARTIAL NEPHRECTOMY Left    TUBAL LIGATION     Social History   Social History Narrative   Patient is married she works as a Charity fundraiser at [employer].  She has a previous career as a Camera operator.      Lives in [place].  2 sons 1 daughter daughter born 30 sons 2001 2003   Former smoker not much no drug use rare alcohol   family history includes Depression in her maternal aunt; Hyperlipidemia in her brother and father; Hypertension in her maternal grandmother; Skin cancer in her mother.   Review of Systems As per HPI otherwise negative  Objective:   Physical Exam $RemoveBefor'@BP'weynexJNtVXc$  100/70   Pulse (!) 105   Ht $R'5\' 7"'aw$  (1.702 m)   Wt 129 lb (58.5 kg)   LMP 06/21/2021 (Approximate)   BMI 20.20 kg/m @  General:  Well-developed, well-nourished and in no acute distress Eyes:  anicteric. Lungs: Clear to auscultation bilaterally. Heart:  S1S2, no rubs, murmurs, gallops. Abdomen:  soft,  mildly tender epigastrium, no hepatosplenomegaly, hernia, or mass and BS+.  Neuro:  A&O x 3.  Psych:  appropriate mood and  Affect.   Data Reviewed: See HPI

## 2021-06-27 ENCOUNTER — Other Ambulatory Visit (HOSPITAL_COMMUNITY)
Admission: RE | Admit: 2021-06-27 | Discharge: 2021-06-27 | Disposition: A | Discharge status: Home / Self Care | Payer: 59 | Source: Ambulatory Visit | Attending: Internal Medicine | Admitting: Internal Medicine

## 2021-06-27 DIAGNOSIS — R10816 Epigastric abdominal tenderness: Secondary | ICD-10-CM | POA: Insufficient documentation

## 2021-06-27 DIAGNOSIS — R1013 Epigastric pain: Secondary | ICD-10-CM | POA: Diagnosis not present

## 2021-06-27 LAB — AMYLASE: Amylase: 32 U/L (ref 28–100)

## 2021-06-27 LAB — CBC WITH DIFFERENTIAL/PLATELET
Abs Immature Granulocytes: 0 10*3/uL (ref 0.00–0.07)
Basophils Absolute: 0.1 10*3/uL (ref 0.0–0.1)
Basophils Relative: 1 %
Eosinophils Absolute: 0.2 10*3/uL (ref 0.0–0.5)
Eosinophils Relative: 4 %
HCT: 39.7 % (ref 36.0–46.0)
Hemoglobin: 13.7 g/dL (ref 12.0–15.0)
Immature Granulocytes: 0 %
Lymphocytes Relative: 40 %
Lymphs Abs: 2.1 10*3/uL (ref 0.7–4.0)
MCH: 31.1 pg (ref 26.0–34.0)
MCHC: 34.5 g/dL (ref 30.0–36.0)
MCV: 90.2 fL (ref 80.0–100.0)
Monocytes Absolute: 0.4 10*3/uL (ref 0.1–1.0)
Monocytes Relative: 8 %
Neutro Abs: 2.5 10*3/uL (ref 1.7–7.7)
Neutrophils Relative %: 47 %
Platelets: 259 10*3/uL (ref 150–400)
RBC: 4.4 MIL/uL (ref 3.87–5.11)
RDW: 11.7 % (ref 11.5–15.5)
WBC: 5.3 10*3/uL (ref 4.0–10.5)
nRBC: 0 % (ref 0.0–0.2)

## 2021-06-27 LAB — COMPREHENSIVE METABOLIC PANEL WITH GFR
ALT: 12 U/L (ref 0–44)
AST: 20 U/L (ref 15–41)
Albumin: 4.2 g/dL (ref 3.5–5.0)
Alkaline Phosphatase: 43 U/L (ref 38–126)
Anion gap: 5 (ref 5–15)
BUN: 19 mg/dL (ref 6–20)
CO2: 30 mmol/L (ref 22–32)
Calcium: 8.7 mg/dL — ABNORMAL LOW (ref 8.9–10.3)
Chloride: 105 mmol/L (ref 98–111)
Creatinine, Ser: 0.74 mg/dL (ref 0.44–1.00)
GFR, Estimated: >60 mL/min
Glucose, Bld: 65 mg/dL — ABNORMAL LOW (ref 70–99)
Potassium: 3.6 mmol/L (ref 3.5–5.1)
Sodium: 140 mmol/L (ref 135–145)
Total Bilirubin: 1.9 mg/dL — ABNORMAL HIGH (ref 0.3–1.2)
Total Protein: 7.4 g/dL (ref 6.5–8.1)

## 2021-06-27 LAB — LIPASE, BLOOD: Lipase: 30 U/L (ref 11–51)

## 2021-07-03 ENCOUNTER — Ambulatory Visit (HOSPITAL_COMMUNITY)
Admission: RE | Admit: 2021-07-03 | Discharge: 2021-07-03 | Disposition: A | Discharge status: Home / Self Care | Payer: 59 | Source: Ambulatory Visit | Attending: Internal Medicine | Admitting: Internal Medicine

## 2021-07-03 ENCOUNTER — Other Ambulatory Visit: Payer: Self-pay

## 2021-07-03 DIAGNOSIS — R1013 Epigastric pain: Secondary | ICD-10-CM | POA: Diagnosis not present

## 2021-07-03 DIAGNOSIS — R10816 Epigastric abdominal tenderness: Secondary | ICD-10-CM | POA: Diagnosis not present

## 2021-07-04 ENCOUNTER — Ambulatory Visit: Payer: Managed Care, Other (non HMO) | Admitting: Student

## 2021-08-12 ENCOUNTER — Other Ambulatory Visit (HOSPITAL_COMMUNITY)
Admission: RE | Admit: 2021-08-12 | Discharge: 2021-08-12 | Disposition: A | Discharge status: Home / Self Care | Payer: 59 | Source: Ambulatory Visit | Attending: Student | Admitting: Student

## 2021-08-12 ENCOUNTER — Ambulatory Visit (INDEPENDENT_AMBULATORY_CARE_PROVIDER_SITE_OTHER): Payer: 59 | Admitting: Student

## 2021-08-12 ENCOUNTER — Other Ambulatory Visit: Payer: Self-pay

## 2021-08-12 VITALS — BP 115/72 | HR 72 | Ht 67.0 in | Wt 131.0 lb

## 2021-08-12 DIAGNOSIS — Z01419 Encounter for gynecological examination (general) (routine) without abnormal findings: Secondary | ICD-10-CM

## 2021-08-12 DIAGNOSIS — N921 Excessive and frequent menstruation with irregular cycle: Secondary | ICD-10-CM | POA: Diagnosis not present

## 2021-08-12 DIAGNOSIS — D252 Subserosal leiomyoma of uterus: Secondary | ICD-10-CM | POA: Diagnosis not present

## 2021-08-12 NOTE — Progress Notes (Signed)
  History:  [name] is a 45 y.o. [phone] who presents to clinic today for irregular periods and abdominal pain.  She reports constant painful periods.  Her cycles are every 14 days and last about 8-9 days. She reports 5 days of no-bleeding. She reports that her spotting and bleeding is sporadic and very frustrating to her. She has alpha-gal allergy and reports significant difficulty in her actvities of daily living--lowered immune system, joint pains, unexplained fevers. She reports episodes of alpha-gal that have caused her to have pancreatitis. She has tried the alpha gal treatment that did not work. SHe also reports that her endometriosis causes abdominal 10/10 and she has had to miss work because this is very disabling for her.  She has a known history of fibroids and endometritis diagnosed from previous provider.   The following portions of the patient's history were reviewed and updated as appropriate: allergies, current medications, family history, past medical history, social history, past surgical history and problem list.  Review of Systems:  Review of Systems  Constitutional: Negative.   HENT: Negative.    Eyes: Negative.   Respiratory: Negative.    Cardiovascular: Negative.   Genitourinary: Negative.   Musculoskeletal:  Positive for joint pain.  Skin: Negative.   Neurological: Negative.   Psychiatric/Behavioral: Negative.       Objective:  Physical Exam BP 115/72   Pulse 72   Ht 5\' 7"  (1.702 m)   Wt 131 lb (59.4 kg)   LMP 08/10/2021   BMI 20.52 kg/m  Physical Exam Constitutional:      Appearance: Normal appearance.  Cardiovascular:     Rate and Rhythm: Normal rate.  Genitourinary:    General: Normal vulva.  Neurological:     Mental Status: She is alert.  Breast exam benign: no lumps, masses, or tenderness palpated NEFG: no discharge in vagina, no tenderness on exam, no lesions on vaginal walls or cervix.    Labs and Imaging No results found for  this or any previous visit (from the past 24 hour(s)).  No results found.  Health Maintenance Due  Topic Date Due   COVID-19 Vaccine (1) Never done   Pneumococcal Vaccine 35-35 Years old (1 - PCV) Never done   INFLUENZA VACCINE  04/29/2021   MAMMOGRAM  08/11/2021     Assessment & Plan:   1. Fibroids, subserous   2. Well woman exam    -give FMLA papers to front desk  -will schedule with MD to talk about possible hysterectomy -will do pap smear and wet prep today -will order Mammo -will order [place] to check on endometriosis and fibroids, which are causing pain and distress  Approximately minutes of total time was spent with this patient on exam and counseling.   Starr [place], [place] 08/12/2021 2:29 PM

## 2021-08-13 LAB — CERVICOVAGINAL ANCILLARY ONLY
Bacterial Vaginitis (gardnerella): NEGATIVE
Candida Glabrata: NEGATIVE
Candida Vaginitis: NEGATIVE
Chlamydia: NEGATIVE
Comment: NEGATIVE
Comment: NEGATIVE
Comment: NEGATIVE
Comment: NEGATIVE
Comment: NEGATIVE
Comment: NORMAL
Neisseria Gonorrhea: NEGATIVE
Trichomonas: NEGATIVE

## 2021-08-14 LAB — CYTOLOGY - PAP
Comment: NEGATIVE
Diagnosis: NEGATIVE
High risk HPV: NEGATIVE

## 2021-08-16 ENCOUNTER — Ambulatory Visit (HOSPITAL_COMMUNITY)
Admission: RE | Admit: 2021-08-16 | Discharge: 2021-08-16 | Disposition: A | Discharge status: Home / Self Care | Payer: 59 | Source: Ambulatory Visit | Attending: Student | Admitting: Student

## 2021-08-16 ENCOUNTER — Other Ambulatory Visit: Payer: Self-pay

## 2021-08-16 DIAGNOSIS — D252 Subserosal leiomyoma of uterus: Secondary | ICD-10-CM | POA: Diagnosis not present

## 2021-08-16 DIAGNOSIS — N888 Other specified noninflammatory disorders of cervix uteri: Secondary | ICD-10-CM | POA: Diagnosis not present

## 2021-08-16 DIAGNOSIS — R109 Unspecified abdominal pain: Secondary | ICD-10-CM | POA: Diagnosis not present

## 2021-08-19 ENCOUNTER — Other Ambulatory Visit: Payer: Self-pay

## 2021-08-19 ENCOUNTER — Encounter: Payer: Self-pay | Admitting: Radiology

## 2021-08-19 ENCOUNTER — Encounter: Payer: Self-pay | Admitting: Student

## 2021-08-19 ENCOUNTER — Ambulatory Visit (INDEPENDENT_AMBULATORY_CARE_PROVIDER_SITE_OTHER): Payer: 59 | Admitting: Allergy and Immunology

## 2021-08-19 ENCOUNTER — Encounter: Payer: Self-pay | Admitting: Allergy and Immunology

## 2021-08-19 VITALS — BP 96/68 | HR 64 | Resp 16 | Ht 67.0 in | Wt 136.2 lb

## 2021-08-19 DIAGNOSIS — J3089 Other allergic rhinitis: Secondary | ICD-10-CM | POA: Diagnosis not present

## 2021-08-19 DIAGNOSIS — T63481A Toxic effect of venom of other arthropod, accidental (unintentional), initial encounter: Secondary | ICD-10-CM

## 2021-08-19 DIAGNOSIS — M25541 Pain in joints of right hand: Secondary | ICD-10-CM | POA: Diagnosis not present

## 2021-08-19 DIAGNOSIS — M25542 Pain in joints of left hand: Secondary | ICD-10-CM | POA: Diagnosis not present

## 2021-08-19 DIAGNOSIS — T7800XA Anaphylactic reaction due to unspecified food, initial encounter: Secondary | ICD-10-CM

## 2021-08-19 MED ORDER — EPINEPHRINE 0.3 MG/0.3ML IJ SOAJ: INTRAMUSCULAR | 3 refills | Status: DC

## 2021-08-19 NOTE — Patient Instructions (Addendum)
  1.  Allergen avoidance measures - mammal meat / Dairy consumption / hymenoptera  2.  EpiPen, Benadryl, MD/ER evaluation for allergic reaction  3.  Daily cetirizine 10 mg - 1 tablet 1 time per day  4.  Complete FMLA paperwork  5. Return to clinic in 1 year or earlier if needed  6. Venom clinic if considering immunotherapy

## 2021-08-19 NOTE — Progress Notes (Signed)
[facility] [facility] [place] [facility] [place]   Follow-up Note  Referring Provider: No ref. provider found Primary Provider: Pcp, No Date of Office Visit: 08/19/2021  Subjective:   [name] (DOB: 08/06/1976) is a 45 y.o. female who returns to the [facility] [place] on 08/19/2021 in re-evaluation of the following:  HPI: [name] returns to this clinic in evaluation of alpha gal syndrome, milk hypersensitivity + lactose intolerance, hymenoptera venom hypersensitivity, hand arthralgia, and allergic rhinitis.  Her last visit to this clinic was her initial evaluation of 21 June 2020.  She continues to miss days from work with inadvertent administration of mammal products and the development of allergic reactions.  As well, she missed a few days of work when being stung by what appeared to be either hornet or wasp on her hand with the development of shortness of breath and blurred vision and could not speak for which she was injected with epinephrine and evaluated in the urgent care center.  For the most part she attempts to remain away from all mammal consumption and she avoids dairy consumption as well.  She tells me that she had a flu vaccine administered in the fall 2021 and had a reaction to this flu vaccine with the development of hives.  She also informs me that she apparently was inflected with influenza in February 2022 for which she was treated with Tamiflu.  She still continues to have problems with arthralgias of her hands and occasionally her elbows.  Allergies as of 08/19/2021       Reactions   Bee Venom Itching, Shortness Of Breath, Swelling   Influenza Vaccines Hives, Rash   Beef-derived Products    Severe stomach cramping, diarrhea   Buspirone Diarrhea, Other (See Comments)   Codeine Nausea And Vomiting   Macrobid [nitrofurantoin Macrocrystal] Itching   Magnesium Nausea And Vomiting   Meat [alpha-gal]    Nitrofurantoin Other (See  Comments)   Severe yeast infection   Prednisone         Medication List    acyclovir ointment 5 % Commonly known as: ZOVIRAX Apply 1 application topically every 4 (four) hours as needed.   albuterol 108 (90 Base) MCG/ACT inhaler Commonly known as: VENTOLIN HFA as needed.   ALPRAZolam 0.25 MG tablet Commonly known as: XANAX Take 1 tablet (0.25 mg total) by mouth 3 (three) times daily as needed.   budesonide-formoterol 160-4.5 MCG/ACT inhaler Commonly known as: SYMBICORT Inhale 1-2 puffs into the lungs daily.   cetirizine 10 MG tablet Commonly known as: ZYRTEC Take 10 mg by mouth daily.   EPINEPHrine 0.3 mg/0.3 mL Soaj injection Commonly known as: EPI-PEN Use as directed for life-threatening allergic reaction.   VITAMIN B-12 PO Take by mouth.   VITAMIN B-6 PO Take by mouth.    Past Medical History:  Diagnosis Date   Allergy to alpha-gal    Anxiety and depression    Chronic endometritis    Migraine headache with aura    Syncope    TMJ (temporomandibular joint syndrome)     Past Surgical History:  Procedure Laterality Date   CESAREAN SECTION CLASSICAL     PARTIAL NEPHRECTOMY Left    TUBAL LIGATION      Review of systems negative except as noted in HPI / PMHx or noted below:  Review of Systems  Constitutional: Negative.   HENT: Negative.    Eyes: Negative.   Respiratory: Negative.    Cardiovascular: Negative.   Gastrointestinal:  Negative.   Genitourinary: Negative.   Musculoskeletal: Negative.   Skin: Negative.   Neurological: Negative.   Endo/Heme/Allergies: Negative.   Psychiatric/Behavioral: Negative.      Objective:   Vitals:   08/19/21 1400  BP: 96/68  Pulse: 64  Resp: 16  SpO2: 99%   Height: 5\' 7"  (170.2 cm)  Weight: 136 lb 3.2 oz (61.8 kg)   Physical Exam Constitutional:      Appearance: She is not diaphoretic.  HENT:     Head: Normocephalic.     Right Ear: Tympanic membrane, ear canal and external ear normal.     Left  Ear: Tympanic membrane, ear canal and external ear normal.     Nose: Nose normal. No mucosal edema or rhinorrhea.     Mouth/Throat:     Pharynx: Uvula midline. No oropharyngeal exudate.  Eyes:     Conjunctiva/sclera: Conjunctivae normal.  Neck:     Thyroid: No thyromegaly.     Trachea: Trachea normal. No tracheal tenderness or tracheal deviation.  Cardiovascular:     Rate and Rhythm: Normal rate and regular rhythm.     Heart sounds: Normal heart sounds, S1 normal and S2 normal. No murmur heard. Pulmonary:     Effort: No respiratory distress.     Breath sounds: Normal breath sounds. No stridor. No wheezing or rales.  Lymphadenopathy:     Head:     Right side of head: No tonsillar adenopathy.     Left side of head: No tonsillar adenopathy.     Cervical: No cervical adenopathy.  Skin:    Findings: No erythema or rash.     Nails: There is no clubbing.  Neurological:     Mental Status: She is alert.    Diagnostics:    Results of blood tests obtained 21 June 2020 identifies alpha gal IgE 0.89 KU/L, beef 0.39 KU/L, lamb 0.10 KU/L, pork 0.15 KU/L, milk 0.18 KU/L, egg less than 0.10 KU/L, hornet 0.10 KU/L, yellowjacket 0.26 KU/L, paper wasp 0.22 KU/L, yellow faced hornet less than 0.10 KU/L, bumblebee 0.10 KU/L, honeybee 0.23 KU/L, tryptase 5.6 UG/L, CRP less than 1 mg/L, CCP 7 units/L, rheumatoid factor less than 1.0 IU/mL, ANA negative  Assessment and Plan:   1. Allergy with anaphylaxis due to food   2. 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 Perennial allergic rhinitis   4. Arthralgia of both hands     1.  Allergen avoidance measures - mammal meat / Dairy consumption / hymenoptera  2.  EpiPen, Benadryl, MD/ER evaluation for allergic reaction  3.  Daily cetirizine 10 mg - 1 tablet 1 time per day  4.  Complete FMLA paperwork  5. Return to clinic in 1 year or earlier if needed  6. Venom clinic if considering immunotherapy   [name] appears to be doing okay as long  as she remains away from consumption of mammal meat and dairy and hymenoptera.  She does have an injectable epinephrine device should she develop an allergic reaction directed against these allergenic substances inadvertently.  She can continue to use cetirizine for her allergic rhinitis.  I informed her that there is no other evaluation that we can do for her arthralgia and if she requires further evaluation she will need to seek out further evaluation with a rheumatologist.  If she is considering starting a course immunotherapy against hymenoptera venom she will need to be evaluated in venom clinic as her blood tests were not Ka-clear about which allergen is producing her  hymenoptera hypersensitivity.  And, I have encouraged her to fill out FMLA paperwork as she does intermittently miss some time from work because of these allergic reactions.  Allena Katz, MD Allergy / Immunology [place]

## 2021-08-20 ENCOUNTER — Encounter: Payer: Self-pay | Admitting: Radiology

## 2021-08-20 ENCOUNTER — Encounter: Payer: Self-pay | Admitting: Allergy and Immunology

## 2021-08-25 ENCOUNTER — Encounter: Payer: Self-pay | Admitting: Allergy and Immunology

## 2021-08-29 ENCOUNTER — Encounter: Payer: Self-pay | Admitting: Student

## 2021-08-30 ENCOUNTER — Encounter: Payer: Self-pay | Admitting: *Deleted

## 2021-08-30 ENCOUNTER — Ambulatory Visit (INDEPENDENT_AMBULATORY_CARE_PROVIDER_SITE_OTHER): Payer: 59 | Admitting: *Deleted

## 2021-08-30 ENCOUNTER — Other Ambulatory Visit: Payer: Self-pay

## 2021-08-30 VITALS — BP 113/72 | HR 81 | Ht 67.0 in | Wt 136.0 lb

## 2021-08-30 DIAGNOSIS — Z87440 Personal history of urinary (tract) infections: Secondary | ICD-10-CM | POA: Diagnosis not present

## 2021-08-30 DIAGNOSIS — R350 Frequency of micturition: Secondary | ICD-10-CM | POA: Diagnosis not present

## 2021-08-30 DIAGNOSIS — R109 Unspecified abdominal pain: Secondary | ICD-10-CM | POA: Diagnosis not present

## 2021-08-30 LAB — POCT URINALYSIS DIP (DEVICE)
Bilirubin Urine: NEGATIVE
Glucose, UA: NEGATIVE mg/dL
Hgb urine dipstick: NEGATIVE
Ketones, ur: NEGATIVE mg/dL
Leukocytes,Ua: NEGATIVE
Nitrite: NEGATIVE
Protein, ur: NEGATIVE mg/dL
Specific Gravity, Urine: 1.02 (ref 1.005–1.030)
Urobilinogen, UA: 0.2 mg/dL (ref 0.0–1.0)
pH: 5.5 (ref 5.0–8.0)

## 2021-08-30 MED ORDER — SULFAMETHOXAZOLE-TRIMETHOPRIM 800-160 MG PO TABS: 1.0000 | ORAL_TABLET | Freq: Two times a day (BID) | ORAL | 0 refills | Status: DC

## 2021-08-30 NOTE — Progress Notes (Signed)
Pt presented for nurse visit earlier today and reported Rt flank pain as well as sx of UTI - frequency. Per pt report, she has history of frequent UTI's and kidney stones. She had urinalysis yesterday @ work which showed bacteria present. Urinalysis performed here today is negative for infection.  Due to pt's history, urine culture was sent and Rx for Bactrim DS was prescribed by Dr. Dione Plover - e-prescribed.  Pt will be notified of urine culture and any additional treatment necessary via Mychart.  She voiced understanding.

## 2021-08-30 NOTE — Progress Notes (Signed)
Chart reviewed for nurse visit. Agree with plan of care.   Urine culture history reviewed in detail. Unfortunately no single agent has shown consistent efficacy across her prior results, trial bactrim and follow up urine culture result.   Clarnce Flock, MD 08/30/21 1:50 PM

## 2021-09-02 LAB — POCT URINALYSIS DIP (DEVICE)
Bilirubin Urine: NEGATIVE
Glucose, UA: NEGATIVE mg/dL
Hgb urine dipstick: NEGATIVE
Ketones, ur: NEGATIVE mg/dL
Leukocytes,Ua: NEGATIVE
Nitrite: NEGATIVE
Protein, ur: NEGATIVE mg/dL
Specific Gravity, Urine: 1.02 (ref 1.005–1.030)
Urobilinogen, UA: 0.2 mg/dL (ref 0.0–1.0)
pH: 5.5 (ref 5.0–8.0)

## 2021-09-04 DIAGNOSIS — R6889 Other general symptoms and signs: Secondary | ICD-10-CM | POA: Diagnosis not present

## 2021-09-04 DIAGNOSIS — B349 Viral infection, unspecified: Secondary | ICD-10-CM | POA: Diagnosis not present

## 2021-09-04 DIAGNOSIS — Z6821 Body mass index (BMI) 21.0-21.9, adult: Secondary | ICD-10-CM | POA: Diagnosis not present

## 2021-09-04 DIAGNOSIS — J029 Acute pharyngitis, unspecified: Secondary | ICD-10-CM | POA: Diagnosis not present

## 2021-09-07 LAB — URINE CULTURE

## 2021-09-09 ENCOUNTER — Ambulatory Visit: Payer: 59 | Admitting: Family Medicine

## 2021-09-13 ENCOUNTER — Other Ambulatory Visit (HOSPITAL_COMMUNITY): Payer: Self-pay

## 2021-09-13 ENCOUNTER — Ambulatory Visit (INDEPENDENT_AMBULATORY_CARE_PROVIDER_SITE_OTHER): Payer: 59 | Admitting: Family Medicine

## 2021-09-13 ENCOUNTER — Other Ambulatory Visit: Payer: Self-pay

## 2021-09-13 ENCOUNTER — Encounter: Payer: Self-pay | Admitting: Family Medicine

## 2021-09-13 VITALS — BP 122/71 | HR 64 | Ht 67.0 in | Wt 136.9 lb

## 2021-09-13 DIAGNOSIS — N939 Abnormal uterine and vaginal bleeding, unspecified: Secondary | ICD-10-CM | POA: Diagnosis not present

## 2021-09-13 DIAGNOSIS — N921 Excessive and frequent menstruation with irregular cycle: Secondary | ICD-10-CM

## 2021-09-13 MED ORDER — CARESTART COVID-19 HOME TEST VI KIT
PACK | 0 refills | Status: DC
  Filled 2021-09-13: qty 4, 4d supply, fill #0

## 2021-09-13 NOTE — Progress Notes (Signed)
° ° °  Subjective:    Patient ID: [name] is a 45 y.o. female presenting with Consult  on 09/13/2021  HPI: Has cycles q o week. Bleeds from 5-9 days. Feels drained following vegan lifestyle.  Previously treated for endometritis. This worked for a few years. Notes painful intercourse. Has low abdominal pain and a fibroid noted on [place]. Has a h/o poor tolerance of contraceptives. Nml pap in 2022. EMB that showed chronic endometritis in 08/09/2019. Cannot take doxy capsules due to new alpha-gal allergies. On Abx x last 3 weeks without improvement in sx's. Desires definitive treatment. H/o SVD x 2 and LTCS with BTL x 1.  Review of Systems  Constitutional:  Negative for chills and fever.  Respiratory:  Negative for shortness of breath.   Cardiovascular:  Negative for chest pain.  Gastrointestinal:  Negative for abdominal pain, nausea and vomiting.  Genitourinary:  Negative for dysuria.  Skin:  Negative for rash.     Objective:    BP 122/71    Pulse 64    Ht 5\' 7"  (1.702 m)    Wt 136 lb 14.4 oz (62.1 kg)    LMP 08/24/2021 (Exact Date)    BMI 21.44 kg/m  Physical Exam Constitutional:      General: She is not in acute distress.    Appearance: She is well-developed.  HENT:     Head: Normocephalic and atraumatic.  Eyes:     General: No scleral icterus. Cardiovascular:     Rate and Rhythm: Normal rate.  Pulmonary:     Effort: Pulmonary effort is normal.  Abdominal:     Palpations: Abdomen is soft.  Musculoskeletal:     Cervical back: Neck supple.  Skin:    General: Skin is warm and dry.  Neurological:     Mental Status: She is alert and oriented to person, place, and time.        Assessment & Plan:   Problem List Items Addressed This Visit       Unprioritized   Menorrhagia    Discussed options of oral meds, IUD, endometrial ablation and hyst. Risks of all reviewed. After careful consideration, she opted for [facility] with possible bilateral salpingectomy if able to find  fimbriated ends of tubes. Had BTL with partial salpingectomy in the past. Risks include but are not limited to bleeding, infection, injury to surrounding structures, including bowel, bladder and ureters, blood clots, and death.  Likelihood of success is high.       Other Visit Diagnoses     Abnormal uterine bleeding    -  Primary   Relevant Orders   CBC (Completed)   TSH (Completed)       Return in about 3 months (around 12/12/2021) for postop check.  [name] 09/13/2021 10:08 AM

## 2021-09-13 NOTE — Patient Instructions (Signed)
Hysterectomy Information A hysterectomy is a surgery in which the uterus is removed. The lowest part of the uterus (cervix), which opens into the vagina, may be removed as well. In some cases, the fallopian tubes, the ovaries,  or both the fallopian tubes and the ovaries may also be removed. This procedure may be done to treat different medical problems. It may also be done to help transgender men feel more masculine. After the procedure, a woman will no longer have menstrual periods and will not be able to become pregnant (sterile). What are the reasons for a hysterectomy? There are many reasons why a person might have this procedure. They include: Persistent, abnormal vaginal bleeding. Long-term (chronic) pelvic pain or infection. Endometriosis. This is when the lining of the uterus (endometrium) starts to grow outside the uterus. Adenomyosis. This is when the endometrium starts to grow in the muscle of the uterus. Pelvic organ prolapse. This is a condition in which the uterus falls down into the vagina. Noncancerous growths in the uterus (uterine fibroids) that cause symptoms. The presence of precancerous cells. Cervical or uterine cancer. Sex change. This helps a transgender man complete his female identity. What are the different types of hysterectomy? There are three different types of hysterectomy: Supracervical hysterectomy. In this type, the top part of the uterus is removed, but not the cervix. Total hysterectomy. In this type, the uterus and cervix are removed. Radical hysterectomy. In this type, the uterus, the cervix, and the tissue that holds the uterus in place (parametrium) are removed. What are the different ways a hysterectomy can be performed? There are many different ways a hysterectomy can be performed, including: Abdominal hysterectomy. In this type, an incision is made in the abdomen. The uterus is removed through this incision. Vaginal hysterectomy. In this type, an  incision is made in the vagina. The uterus is removed through this incision. There are no abdominal incisions. Conventional laparoscopic hysterectomy. In this type, 3 or 4 small incisions are made in the abdomen. A thin, lighted tube with a camera (laparoscope) is inserted into one of the incisions. Other tools are put through the other incisions. The uterus is cut into small pieces. The small pieces are removed through the incisions or the vagina. Laparoscopically assisted vaginal hysterectomy (LAVH). In this type, 3 or 4 small incisions are made in the abdomen. Part of the surgery is performed laparoscopically and the other part is done vaginally. The uterus is removed through the vagina. Robot-assisted laparoscopic hysterectomy. In this type, a laparoscope and other tools are inserted into 3 or 4 small incisions in the abdomen. A computer-controlled device is used to give the surgeon a 3D image and to help control the surgical instruments. This allows for more precise movements of surgical instruments. The uterus is cut into small pieces and removed through the incisions or the vagina. Discuss the options with your health care provider to determine which type is the right one for you. What are the risks of this surgery? Generally, this is a safe procedure. However, problems may occur, including: Bleeding and risk of blood transfusion. Tell your health care provider if you do not want to receive any blood products. Blood clots in the legs or lung. Infection. Damage to nearby structures or organs. Allergic reactions to medicines. Having to change to an abdominal hysterectomy after starting a less invasive technique. What to expect after a hysterectomy You will be given pain medicine. You may need to stay in the hospital for 1-2 days  to recover, depending on the type of hysterectomy you had. You will need to have someone with you for the first 3-5 days after you go home. You will need to follow up  with your surgeon in 2-4 weeks after surgery to evaluate your progress. If the ovaries are removed, you will have early menopause symptoms such as hot flashes, night sweats, and insomnia. If you had a hysterectomy for a problem that was not cancer or a condition that could not lead to cancer, then you no longer need Pap tests. However, even if you no longer need a Pap test, get regular pelvic exams to make sure no other problems are developing. Questions to ask your health care provider Is a hysterectomy medically necessary? Do I have other treatment options for my condition? What are my options for hysterectomy procedure? What organs and tissues need to be removed? What are the risks? What are the benefits? How long will I need to stay in the hospital after the procedure? How long will I need to recover at home? What symptoms can I expect after the procedure? Summary A hysterectomy is a surgery in which the uterus is removed. The fallopian tubes, the ovaries, or both may be removed as well. This procedure may be done to treat different medical problems. It may also be done to complete your female identity during a sex change. After the procedure, a woman will no longer have a menstrual period and will not be able to become pregnant. There are three types of hysterectomy. Discuss with your health care provider which options are right for you. This is a safe procedure, though there are some risks. Risks include infection, bleeding, blood clots, or damage to nearby organs. This information is not intended to replace advice given to you by your health care provider. Make sure you discuss any questions you have with your health care provider. Document Revised: 07/15/2019 Document Reviewed: 07/15/2019 Elsevier Patient Education  [place].

## 2021-09-14 LAB — CBC
Hematocrit: 41.1 % (ref 34.0–46.6)
Hemoglobin: 14.1 g/dL (ref 11.1–15.9)
MCH: 30.9 pg (ref 26.6–33.0)
MCHC: 34.3 g/dL (ref 31.5–35.7)
MCV: 90 fL (ref 79–97)
Platelets: 279 10*3/uL (ref 150–450)
RBC: 4.56 x10E6/uL (ref 3.77–5.28)
RDW: 11.7 % (ref 11.7–15.4)
WBC: 6.8 10*3/uL (ref 3.4–10.8)

## 2021-09-14 LAB — TSH: TSH: 1.65 u[IU]/mL (ref 0.450–4.500)

## 2021-09-16 DIAGNOSIS — I951 Orthostatic hypotension: Secondary | ICD-10-CM | POA: Insufficient documentation

## 2021-09-16 DIAGNOSIS — N189 Chronic kidney disease, unspecified: Secondary | ICD-10-CM | POA: Insufficient documentation

## 2021-09-16 DIAGNOSIS — N920 Excessive and frequent menstruation with regular cycle: Secondary | ICD-10-CM | POA: Insufficient documentation

## 2021-09-16 DIAGNOSIS — Z87442 Personal history of urinary calculi: Secondary | ICD-10-CM | POA: Insufficient documentation

## 2021-09-16 NOTE — Assessment & Plan Note (Signed)
Discussed options of oral meds, IUD, endometrial ablation and hyst. Risks of all reviewed. After careful consideration, she opted for [facility] with possible bilateral salpingectomy if able to find fimbriated ends of tubes. Had BTL with partial salpingectomy in the past. Risks include but are not limited to bleeding, infection, injury to surrounding structures, including bowel, bladder and ureters, blood clots, and death.  Likelihood of success is high.

## 2021-09-19 ENCOUNTER — Ambulatory Visit: Payer: 59

## 2021-10-06 ENCOUNTER — Encounter: Payer: Self-pay | Admitting: Radiology

## 2021-10-08 DIAGNOSIS — L820 Inflamed seborrheic keratosis: Secondary | ICD-10-CM | POA: Diagnosis not present

## 2021-10-17 ENCOUNTER — Ambulatory Visit
Admission: RE | Admit: 2021-10-17 | Discharge: 2021-10-17 | Disposition: A | Discharge status: Home / Self Care | Payer: 59 | Source: Ambulatory Visit | Attending: Student | Admitting: Student

## 2021-10-17 DIAGNOSIS — D252 Subserosal leiomyoma of uterus: Secondary | ICD-10-CM

## 2021-10-17 DIAGNOSIS — Z1231 Encounter for screening mammogram for malignant neoplasm of breast: Secondary | ICD-10-CM | POA: Diagnosis not present

## 2021-10-18 ENCOUNTER — Other Ambulatory Visit: Payer: Self-pay | Admitting: Student

## 2021-10-18 DIAGNOSIS — R928 Other abnormal and inconclusive findings on diagnostic imaging of breast: Secondary | ICD-10-CM

## 2021-10-21 ENCOUNTER — Ambulatory Visit
Admission: RE | Admit: 2021-10-21 | Discharge: 2021-10-21 | Disposition: A | Discharge status: Home / Self Care | Payer: 59 | Source: Ambulatory Visit | Attending: Student | Admitting: Student

## 2021-10-21 DIAGNOSIS — R922 Inconclusive mammogram: Secondary | ICD-10-CM | POA: Diagnosis not present

## 2021-10-21 DIAGNOSIS — R928 Other abnormal and inconclusive findings on diagnostic imaging of breast: Secondary | ICD-10-CM

## 2021-10-21 DIAGNOSIS — N6011 Diffuse cystic mastopathy of right breast: Secondary | ICD-10-CM | POA: Diagnosis not present

## 2021-10-21 DIAGNOSIS — N6002 Solitary cyst of left breast: Secondary | ICD-10-CM | POA: Diagnosis not present

## 2021-10-22 NOTE — H&P (Signed)
[name] is an 46 y.o. [phone] female.   Chief Complaint: abnormal bleeding HPI: Bleeding every other week. Has had painful intercourse, has fibroids. Has h/o SVD x 2 with LTCS x 1 and is s/p BTL. Desires permanent treatment.  Past Medical History:  Diagnosis Date   Allergy to alpha-gal    Anxiety and depression    Chronic endometritis    Migraine headache with aura    Syncope    TMJ (temporomandibular joint syndrome)     Past Surgical History:  Procedure Laterality Date   CESAREAN SECTION CLASSICAL     DILATION AND CURETTAGE OF UTERUS     PARTIAL NEPHRECTOMY Left    TUBAL LIGATION      Family History  Problem Relation Age of Onset   Breast cancer Mother    Skin cancer Mother    Hyperlipidemia Father    Depression Maternal Aunt    Hypertension Maternal Grandmother    Hyperlipidemia Brother    Social History:  reports that she has quit smoking. Her smoking use included cigarettes. She smoked an average of .25 packs per day. She has never used smokeless tobacco. She reports current alcohol use of about 1.0 standard drink per week. She reports that she does not use drugs.  Allergies:  Allergies  Allergen Reactions   Bee Venom Itching, Shortness Of Breath and Swelling   Meat [Alpha-Gal] Anaphylaxis   Influenza Vaccines Hives and Rash   Beef-Derived Products     Severe stomach cramping, diarrhea   Buspirone Diarrhea and Other (See Comments)   Codeine Nausea And Vomiting   Macrobid [Nitrofurantoin Macrocrystal] Itching    Severe yeast   Magnesium Nausea And Vomiting   Prednisone     No medications prior to admission.    A comprehensive review of systems was negative.  Last menstrual period 10/09/2021. LMP 10/09/2021 (Approximate) Comment: tubes tied. hysterctomy scheduled 1.31.23 General appearance: alert, cooperative, and appears stated age Head: Normocephalic, without obvious abnormality, atraumatic Neck: supple, symmetrical, trachea midline Lungs:   normal effort Heart: regular rate and rhythm, S1, S2 normal, no murmur, click, rub or gallop Abdomen: soft, non-tender; bowel sounds normal; no masses,  no organomegaly Extremities: extremities normal, atraumatic, no cyanosis or edema Skin: Skin color, texture, turgor normal. No rashes or lesions Neurologic: Grossly normal   Lab Results  Component Value Date   WBC 6.8 09/13/2021   HGB 14.1 09/13/2021   HCT 41.1 09/13/2021   MCV 90 09/13/2021   PLT 279 09/13/2021   Lab Results  Component Value Date   HCG <5.0 07/22/2018     Assessment/Plan Principal Problem:   Menorrhagia Active Problems:   Chronic endometritis  For TVH with possible salpingectomy if able Risks include but are not limited to bleeding, infection, injury to surrounding structures, including bowel, bladder and ureters, blood clots, and death.  Likelihood of success is high.    Donnamae Jude 10/22/2021, 3:19 PM

## 2021-10-24 NOTE — Progress Notes (Signed)
Surgical Instructions    Your procedure is scheduled on Tuesday January 31st.  Report to [facility] Main Entrance "A" at 5:30 A.M., then check in with the Admitting office.  Call this number if you have problems the morning of surgery:  [phone]   If you have any questions prior to your surgery date call [phone]: Open Monday-Friday 8am-4pm    Remember:  Do not eat after midnight the night before your surgery  You may drink clear liquids until 4:30am the morning of your surgery.   Clear liquids allowed are: Water, Non-Citrus Juices (without pulp), Carbonated Beverages, Clear Tea, Black Coffee ONLY (NO MILK, CREAM OR POWDERED CREAMER of any kind), and Gatorade    Take these medicines the morning of surgery with A SIP OF WATER cetirizine (ZYRTEC) 10 MG tablet  IF NEEDED  albuterol (VENTOLIN HFA) 108 (90 Base) MCG/ACT inhaler - please bring with you to the hospital ALPRAZolam (XANAX) 0.25 MG tablet budesonide-formoterol (SYMBICORT) 160-4.5 MCG/ACT inhaler - please bring with you to the hospital EPINEPHrine 0.3 mg/0.3 mL IJ SOAJ injection   As of today, STOP taking any Aspirin (unless otherwise instructed by your surgeon) Aleve, Naproxen, Ibuprofen, Motrin, Advil, Goody's, BC's, all herbal medications, fish oil, and all vitamins.  After your COVID test   You are not required to quarantine however you are required to wear a well-fitting mask when you are out and around people not in your household.  If your mask becomes wet or soiled, replace with a new one.  Wash your hands often with soap and water for 20 seconds or clean your hands with an alcohol-based hand sanitizer that contains at least 60% alcohol.  Do not share personal items.  Notify your provider: if you are in close contact with someone who has COVID  or if you develop a fever of 100.4 or greater, sneezing, cough, sore throat, shortness of breath or body aches.           Do not wear jewelry or makeup Do not  wear lotions, powders, perfumes, or deodorant. Do not shave 48 hours prior to surgery.   Do not bring valuables to the hospital. DO Not wear nail polish, gel polish, artificial nails, or any other type of covering on natural nails (fingers and toes) If you have artificial nails or gel coating that need to be removed by a nail salon, please have this removed prior to surgery. Artificial nails or gel coating may interfere with anesthesia's ability to adequately monitor your vital signs.             [facility] is not responsible for any belongings or valuables.  Do NOT Smoke (Tobacco/Vaping)  24 hours prior to your procedure  If you use a CPAP at night, you may bring your mask for your overnight stay.   Contacts, glasses, hearing aids, dentures or partials may not be worn into surgery, please bring cases for these belongings   For patients admitted to the hospital, discharge time will be determined by your treatment team.   Patients discharged the day of surgery will not be allowed to drive home, and someone needs to stay with them for 24 hours.  NO VISITORS WILL BE ALLOWED IN PRE-OP WHERE PATIENTS ARE PREPPED FOR SURGERY.  ONLY 1 SUPPORT PERSON MAY BE PRESENT IN THE WAITING ROOM WHILE YOU ARE IN SURGERY.  IF YOU ARE TO BE ADMITTED, ONCE YOU ARE IN YOUR ROOM YOU WILL BE ALLOWED TWO (2) VISITORS. 1 (ONE) VISITOR  MAY STAY OVERNIGHT BUT MUST ARRIVE TO THE ROOM BY 8pm.  Minor children may have two parents present. Special consideration for safety and communication needs will be reviewed on a case by case basis.  Special instructions:    Oral Hygiene is also important to reduce your risk of infection.  Remember - BRUSH YOUR TEETH THE MORNING OF SURGERY WITH YOUR REGULAR TOOTHPASTE   [facility]- Preparing For Surgery  Before surgery, you can play an important role. Because skin is not sterile, your skin needs to be as free of germs as possible. You can reduce the number of germs on your skin by  washing with CHG (chlorahexidine gluconate) Soap before surgery.  CHG is an antiseptic cleaner which kills germs and bonds with the skin to continue killing germs even after washing.     Please do not use if you have an allergy to CHG or antibacterial soaps. If your skin becomes reddened/irritated stop using the CHG.  Do not shave (including legs and underarms) for at least 48 hours prior to first CHG shower. It is OK to shave your face.  Please follow these instructions carefully.     Shower the NIGHT BEFORE SURGERY and the MORNING OF SURGERY with CHG Soap.   If you chose to wash your hair, wash your hair first as usual with your normal shampoo. After you shampoo, rinse your hair and body thoroughly to remove the shampoo.  Then [employer] and genitals (private parts) with your normal soap and rinse thoroughly to remove soap.  After that Use CHG Soap as you would any other liquid soap. You can apply CHG directly to the skin and wash gently with a scrungie or a clean washcloth.   Apply the CHG Soap to your body ONLY FROM THE NECK DOWN.  Do not use on open wounds or open sores. Avoid contact with your eyes, ears, mouth and genitals (private parts). Wash Face and genitals (private parts)  with your normal soap.   Wash thoroughly, paying special attention to the area where your surgery will be performed.  Thoroughly rinse your body with warm water from the neck down.  DO NOT shower/wash with your normal soap after using and rinsing off the CHG Soap.  Pat yourself dry with a CLEAN TOWEL.  Wear CLEAN PAJAMAS to bed the night before surgery  Place CLEAN SHEETS on your bed the night before your surgery  DO NOT SLEEP WITH PETS.   Day of Surgery:  Take a shower with CHG soap. Wear Clean/Comfortable clothing the morning of surgery Do not apply any deodorants/lotions.   Remember to brush your teeth WITH YOUR REGULAR TOOTHPASTE.   Please read over the following fact sheets that you were  given.

## 2021-10-25 ENCOUNTER — Encounter (HOSPITAL_COMMUNITY)
Admission: RE | Admit: 2021-10-25 | Discharge: 2021-10-25 | Disposition: A | Discharge status: Home / Self Care | Payer: 59 | Source: Ambulatory Visit | Attending: Family Medicine | Admitting: Family Medicine

## 2021-10-25 ENCOUNTER — Encounter (HOSPITAL_COMMUNITY): Payer: Self-pay

## 2021-10-25 ENCOUNTER — Other Ambulatory Visit: Payer: Self-pay

## 2021-10-25 VITALS — BP 112/80 | HR 71 | Temp 98.3°F | Resp 17 | Ht 67.0 in | Wt 137.0 lb

## 2021-10-25 DIAGNOSIS — Z01818 Encounter for other preprocedural examination: Secondary | ICD-10-CM

## 2021-10-25 DIAGNOSIS — Z01812 Encounter for preprocedural laboratory examination: Secondary | ICD-10-CM | POA: Insufficient documentation

## 2021-10-25 DIAGNOSIS — N921 Excessive and frequent menstruation with irregular cycle: Secondary | ICD-10-CM | POA: Diagnosis not present

## 2021-10-25 DIAGNOSIS — Z20822 Contact with and (suspected) exposure to covid-19: Secondary | ICD-10-CM | POA: Insufficient documentation

## 2021-10-25 HISTORY — DX: Pneumonia, unspecified organism: J18.9

## 2021-10-25 HISTORY — DX: Personal history of COVID-19: Z86.16

## 2021-10-25 HISTORY — DX: Personal history of urinary calculi: Z87.442

## 2021-10-25 HISTORY — DX: Orthostatic hypotension: I95.1

## 2021-10-25 HISTORY — DX: Acute pancreatitis without necrosis or infection, unspecified: K85.90

## 2021-10-25 LAB — CBC WITH DIFFERENTIAL/PLATELET
Abs Immature Granulocytes: 0.01 10*3/uL (ref 0.00–0.07)
Basophils Absolute: 0.1 10*3/uL (ref 0.0–0.1)
Basophils Relative: 1 %
Eosinophils Absolute: 0.1 10*3/uL (ref 0.0–0.5)
Eosinophils Relative: 2 %
HCT: 41.2 % (ref 36.0–46.0)
Hemoglobin: 14.1 g/dL (ref 12.0–15.0)
Immature Granulocytes: 0 %
Lymphocytes Relative: 40 %
Lymphs Abs: 3.1 10*3/uL (ref 0.7–4.0)
MCH: 30.9 pg (ref 26.0–34.0)
MCHC: 34.2 g/dL (ref 30.0–36.0)
MCV: 90.2 fL (ref 80.0–100.0)
Monocytes Absolute: 0.7 10*3/uL (ref 0.1–1.0)
Monocytes Relative: 9 %
Neutro Abs: 3.7 10*3/uL (ref 1.7–7.7)
Neutrophils Relative %: 48 %
Platelets: 261 10*3/uL (ref 150–400)
RBC: 4.57 MIL/uL (ref 3.87–5.11)
RDW: 11.7 % (ref 11.5–15.5)
WBC: 7.7 10*3/uL (ref 4.0–10.5)
nRBC: 0 % (ref 0.0–0.2)

## 2021-10-25 LAB — TYPE AND SCREEN
ABO/RH(D): O NEG
Antibody Screen: NEGATIVE

## 2021-10-25 NOTE — Progress Notes (Signed)
PCP - No PCP per patient Cardiologist - patient denies  PPM/ICD - n/a Device Orders -  Rep Notified -   Chest x-ray - n/a EKG - n/a Stress Test - 10/29/20 ECHO - 10/29/20 Cardiac Cath - patient denies  Sleep Study - n/a CPAP -   Fasting Blood Sugar -  n/a Checks Blood Sugar _____ times a day  Blood Thinner Instructions: n/a Aspirin Instructions: n/a  ERAS Protcol - clears until 0430 PRE-SURGERY Ensure or G2- none ordered  COVID TEST- at PAT appointment   Anesthesia review: n/a  Patient denies shortness of breath, fever, cough and chest pain at PAT appointment   All instructions explained to the patient, with a verbal understanding of the material. Patient agrees to go over the instructions while at home for a better understanding. Patient also instructed to self quarantine after being tested for COVID-19. The opportunity to ask questions was provided.

## 2021-10-26 LAB — SARS CORONAVIRUS 2 (TAT 6-24 HRS): SARS Coronavirus 2: NEGATIVE

## 2021-10-28 NOTE — Anesthesia Preprocedure Evaluation (Addendum)
Anesthesia Evaluation  Patient identified by MRN, date of birth, ID band Patient awake    Reviewed: Allergy & Precautions, NPO status , Patient's Chart, lab work & pertinent test results  History of Anesthesia Complications Negative for: history of anesthetic complications  Airway Mallampati: II  TM Distance: >3 FB Neck ROM: Full    Dental no notable dental hx.    Pulmonary former smoker,    Pulmonary exam normal        Cardiovascular negative cardio ROS Normal cardiovascular exam     Neuro/Psych  Headaches, Anxiety Depression    GI/Hepatic negative GI ROS, Neg liver ROS,   Endo/Other  negative endocrine ROS  Renal/GU negative Renal ROS  negative genitourinary   Musculoskeletal negative musculoskeletal ROS (+)   Abdominal   Peds  Hematology negative hematology ROS (+)   Anesthesia Other Findings Day of surgery medications reviewed with patient.  Reproductive/Obstetrics Uterine fibroids                            Anesthesia Physical Anesthesia Plan  ASA: 2  Anesthesia Plan: General   Post-op Pain Management: Tylenol PO (pre-op) and Toradol IV (intra-op)   Induction: Intravenous  PONV Risk Score and Plan: 4 or greater and Scopolamine patch - Pre-op, Midazolam, Dexamethasone and Ondansetron  Airway Management Planned: LMA  Additional Equipment: None  Intra-op Plan:   Post-operative Plan: Extubation in OR  Informed Consent: I have reviewed the patients History and Physical, chart, labs and discussed the procedure including the risks, benefits and alternatives for the proposed anesthesia with the patient or authorized representative who has indicated his/her understanding and acceptance.     Dental advisory given  Plan Discussed with: CRNA  Anesthesia Plan Comments:        Anesthesia Quick Evaluation

## 2021-10-29 ENCOUNTER — Other Ambulatory Visit: Payer: Self-pay

## 2021-10-29 ENCOUNTER — Ambulatory Visit (HOSPITAL_COMMUNITY)
Admission: RE | Admit: 2021-10-29 | Discharge: 2021-10-30 | Disposition: A | Discharge status: Home / Self Care | Payer: 59 | Attending: Family Medicine | Admitting: Family Medicine

## 2021-10-29 ENCOUNTER — Encounter (HOSPITAL_COMMUNITY)
Admission: RE | Disposition: A | Discharge status: Home / Self Care | Payer: Self-pay | Source: Home / Self Care | Attending: Family Medicine

## 2021-10-29 ENCOUNTER — Ambulatory Visit (HOSPITAL_COMMUNITY): Payer: 59 | Admitting: Anesthesiology

## 2021-10-29 ENCOUNTER — Encounter (HOSPITAL_COMMUNITY): Payer: Self-pay | Admitting: Family Medicine

## 2021-10-29 ENCOUNTER — Ambulatory Visit (HOSPITAL_COMMUNITY): Payer: 59 | Admitting: Vascular Surgery

## 2021-10-29 DIAGNOSIS — N920 Excessive and frequent menstruation with regular cycle: Secondary | ICD-10-CM | POA: Diagnosis present

## 2021-10-29 DIAGNOSIS — D259 Leiomyoma of uterus, unspecified: Secondary | ICD-10-CM

## 2021-10-29 DIAGNOSIS — N938 Other specified abnormal uterine and vaginal bleeding: Secondary | ICD-10-CM | POA: Diagnosis not present

## 2021-10-29 DIAGNOSIS — Z87891 Personal history of nicotine dependence: Secondary | ICD-10-CM | POA: Insufficient documentation

## 2021-10-29 DIAGNOSIS — Z90710 Acquired absence of both cervix and uterus: Secondary | ICD-10-CM | POA: Diagnosis present

## 2021-10-29 DIAGNOSIS — N939 Abnormal uterine and vaginal bleeding, unspecified: Secondary | ICD-10-CM | POA: Insufficient documentation

## 2021-10-29 DIAGNOSIS — N921 Excessive and frequent menstruation with irregular cycle: Secondary | ICD-10-CM | POA: Diagnosis not present

## 2021-10-29 DIAGNOSIS — N736 Female pelvic peritoneal adhesions (postinfective): Secondary | ICD-10-CM | POA: Insufficient documentation

## 2021-10-29 DIAGNOSIS — D251 Intramural leiomyoma of uterus: Secondary | ICD-10-CM | POA: Insufficient documentation

## 2021-10-29 DIAGNOSIS — N711 Chronic inflammatory disease of uterus: Secondary | ICD-10-CM | POA: Diagnosis present

## 2021-10-29 HISTORY — PX: VAGINAL HYSTERECTOMY: SHX2639

## 2021-10-29 LAB — ABO/RH: ABO/RH(D): O NEG

## 2021-10-29 LAB — POCT PREGNANCY, URINE: Preg Test, Ur: NEGATIVE

## 2021-10-29 SURGERY — HYSTERECTOMY, VAGINAL
Anesthesia: General | Site: Uterus | Laterality: Bilateral

## 2021-10-29 MED ORDER — KETOROLAC TROMETHAMINE 30 MG/ML IJ SOLN
30.0000 mg | Freq: Four times a day (QID) | INTRAMUSCULAR | Status: AC
  Administered 2021-10-29 – 2021-10-30 (×4): 30 mg via INTRAVENOUS
  Filled 2021-10-29 (×4): qty 1

## 2021-10-29 MED ORDER — ONDANSETRON HCL 4 MG PO TABS: 4.0000 mg | ORAL_TABLET | Freq: Four times a day (QID) | ORAL | Status: DC | PRN

## 2021-10-29 MED ORDER — ACETAMINOPHEN 500 MG PO TABS
1000.0000 mg | ORAL_TABLET | ORAL | Status: AC
  Administered 2021-10-29: 1000 mg via ORAL
  Filled 2021-10-29: qty 2

## 2021-10-29 MED ORDER — ONDANSETRON HCL 4 MG/2ML IJ SOLN
INTRAMUSCULAR | Status: DC | PRN
  Administered 2021-10-29: 4 mg via INTRAVENOUS

## 2021-10-29 MED ORDER — OXYCODONE HCL 5 MG PO TABS
ORAL_TABLET | ORAL | Status: AC
  Filled 2021-10-29: qty 1

## 2021-10-29 MED ORDER — OXYCODONE HCL 5 MG PO TABS
5.0000 mg | ORAL_TABLET | ORAL | Status: DC | PRN
  Administered 2021-10-29 – 2021-10-30 (×5): 5 mg via ORAL
  Filled 2021-10-29 (×4): qty 1

## 2021-10-29 MED ORDER — LIDOCAINE-EPINEPHRINE 1 %-1:100000 IJ SOLN
INTRAMUSCULAR | Status: DC | PRN
  Administered 2021-10-29: 20 mL

## 2021-10-29 MED ORDER — OXYCODONE HCL 5 MG/5ML PO SOLN: 5.0000 mg | Freq: Once | ORAL | Status: DC | PRN

## 2021-10-29 MED ORDER — MENTHOL 3 MG MT LOZG: 1.0000 | LOZENGE | OROMUCOSAL | Status: DC | PRN

## 2021-10-29 MED ORDER — ALPRAZOLAM 0.5 MG PO TABS: 0.2500 mg | ORAL_TABLET | Freq: Three times a day (TID) | ORAL | Status: DC | PRN

## 2021-10-29 MED ORDER — DOCUSATE SODIUM 100 MG PO CAPS: 100.0000 mg | ORAL_CAPSULE | Freq: Two times a day (BID) | ORAL | Status: DC

## 2021-10-29 MED ORDER — TEMAZEPAM 15 MG PO CAPS: 15.0000 mg | ORAL_CAPSULE | Freq: Every evening | ORAL | Status: DC | PRN

## 2021-10-29 MED ORDER — ESTRADIOL 0.1 MG/GM VA CREA
TOPICAL_CREAM | VAGINAL | Status: AC
  Filled 2021-10-29: qty 42.5

## 2021-10-29 MED ORDER — POVIDONE-IODINE 10 % EX SWAB
2.0000 "application " | Freq: Once | CUTANEOUS | Status: AC
  Administered 2021-10-29: 2 via TOPICAL

## 2021-10-29 MED ORDER — LIDOCAINE HCL (CARDIAC) PF 100 MG/5ML IV SOSY
PREFILLED_SYRINGE | INTRAVENOUS | Status: DC | PRN
  Administered 2021-10-29: 60 mg via INTRAVENOUS

## 2021-10-29 MED ORDER — DIPHENHYDRAMINE HCL 50 MG/ML IJ SOLN
INTRAMUSCULAR | Status: AC
  Filled 2021-10-29: qty 1

## 2021-10-29 MED ORDER — ACETAMINOPHEN 500 MG PO TABS
1000.0000 mg | ORAL_TABLET | Freq: Four times a day (QID) | ORAL | Status: DC
  Administered 2021-10-29 – 2021-10-30 (×3): 1000 mg via ORAL
  Filled 2021-10-29 (×3): qty 2

## 2021-10-29 MED ORDER — FENTANYL CITRATE (PF) 250 MCG/5ML IJ SOLN
INTRAMUSCULAR | Status: AC
  Filled 2021-10-29: qty 5

## 2021-10-29 MED ORDER — FENTANYL CITRATE (PF) 100 MCG/2ML IJ SOLN
INTRAMUSCULAR | Status: DC | PRN
  Administered 2021-10-29: 50 ug via INTRAVENOUS
  Administered 2021-10-29 (×2): 25 ug via INTRAVENOUS
  Administered 2021-10-29: 50 ug via INTRAVENOUS

## 2021-10-29 MED ORDER — FENTANYL CITRATE (PF) 100 MCG/2ML IJ SOLN
25.0000 ug | INTRAMUSCULAR | Status: DC | PRN
  Administered 2021-10-29 (×3): 50 ug via INTRAVENOUS

## 2021-10-29 MED ORDER — CEFAZOLIN SODIUM-DEXTROSE 2-4 GM/100ML-% IV SOLN
2.0000 g | INTRAVENOUS | Status: AC
  Administered 2021-10-29: 2 g via INTRAVENOUS
  Filled 2021-10-29: qty 100

## 2021-10-29 MED ORDER — SCOPOLAMINE 1 MG/3DAYS TD PT72
1.0000 | MEDICATED_PATCH | Freq: Once | TRANSDERMAL | Status: DC
  Administered 2021-10-29: 1.5 mg via TRANSDERMAL
  Filled 2021-10-29: qty 1

## 2021-10-29 MED ORDER — PROPOFOL 10 MG/ML IV BOLUS
INTRAVENOUS | Status: AC
  Filled 2021-10-29: qty 20

## 2021-10-29 MED ORDER — MIDAZOLAM HCL 2 MG/2ML IJ SOLN
INTRAMUSCULAR | Status: AC
  Filled 2021-10-29: qty 2

## 2021-10-29 MED ORDER — MIDAZOLAM HCL 5 MG/5ML IJ SOLN
INTRAMUSCULAR | Status: DC | PRN
  Administered 2021-10-29: 2 mg via INTRAVENOUS

## 2021-10-29 MED ORDER — OXYCODONE HCL 5 MG PO TABS
5.0000 mg | ORAL_TABLET | Freq: Once | ORAL | Status: DC | PRN
Start: 2021-10-29 — End: 2021-10-29

## 2021-10-29 MED ORDER — DEXAMETHASONE SODIUM PHOSPHATE 4 MG/ML IJ SOLN
INTRAMUSCULAR | Status: DC | PRN
  Administered 2021-10-29: 4 mg via INTRAVENOUS

## 2021-10-29 MED ORDER — DIPHENHYDRAMINE HCL 50 MG/ML IJ SOLN
12.5000 mg | Freq: Four times a day (QID) | INTRAMUSCULAR | Status: DC | PRN
  Administered 2021-10-29: 12.5 mg via INTRAVENOUS
  Filled 2021-10-29: qty 1

## 2021-10-29 MED ORDER — LIDOCAINE-EPINEPHRINE 1 %-1:100000 IJ SOLN
INTRAMUSCULAR | Status: AC
  Filled 2021-10-29: qty 2

## 2021-10-29 MED ORDER — OXYCODONE-ACETAMINOPHEN 5-325 MG PO TABS: 1.0000 | ORAL_TABLET | Freq: Four times a day (QID) | ORAL | 0 refills | Status: DC | PRN

## 2021-10-29 MED ORDER — HYDROMORPHONE HCL 1 MG/ML IJ SOLN
INTRAMUSCULAR | Status: AC
  Filled 2021-10-29: qty 1

## 2021-10-29 MED ORDER — BISACODYL 10 MG RE SUPP: 10.0000 mg | Freq: Every day | RECTAL | Status: DC | PRN

## 2021-10-29 MED ORDER — SODIUM CHLORIDE 0.9 % IV SOLN: INTRAVENOUS | Status: DC

## 2021-10-29 MED ORDER — FENTANYL CITRATE (PF) 100 MCG/2ML IJ SOLN
INTRAMUSCULAR | Status: AC
  Filled 2021-10-29: qty 2

## 2021-10-29 MED ORDER — LACTATED RINGERS IV SOLN: INTRAVENOUS | Status: DC | PRN

## 2021-10-29 MED ORDER — ALBUTEROL SULFATE (2.5 MG/3ML) 0.083% IN NEBU: 3.0000 mL | INHALATION_SOLUTION | Freq: Four times a day (QID) | RESPIRATORY_TRACT | Status: DC | PRN

## 2021-10-29 MED ORDER — KETOROLAC TROMETHAMINE 30 MG/ML IJ SOLN
INTRAMUSCULAR | Status: DC | PRN
Start: 2021-10-29 — End: 2021-10-29
  Administered 2021-10-29: 30 mg via INTRAVENOUS

## 2021-10-29 MED ORDER — ALPRAZOLAM 0.5 MG PO TABS: 0.2500 mg | ORAL_TABLET | Freq: Once | ORAL | Status: DC | PRN

## 2021-10-29 MED ORDER — SOD CITRATE-CITRIC ACID 500-334 MG/5ML PO SOLN
30.0000 mL | ORAL | Status: AC
  Administered 2021-10-29: 30 mL via ORAL
  Filled 2021-10-29: qty 30

## 2021-10-29 MED ORDER — ACETAMINOPHEN 500 MG PO TABS: 1000.0000 mg | ORAL_TABLET | Freq: Once | ORAL | Status: DC

## 2021-10-29 MED ORDER — DEXTROSE IN LACTATED RINGERS 5 % IV SOLN: INTRAVENOUS | Status: DC

## 2021-10-29 MED ORDER — PROMETHAZINE HCL 25 MG/ML IJ SOLN: 6.2500 mg | INTRAMUSCULAR | Status: DC | PRN

## 2021-10-29 MED ORDER — PROPOFOL 10 MG/ML IV BOLUS
INTRAVENOUS | Status: DC | PRN
  Administered 2021-10-29: 120 mg via INTRAVENOUS

## 2021-10-29 MED ORDER — ONDANSETRON HCL 4 MG/2ML IJ SOLN: 4.0000 mg | Freq: Four times a day (QID) | INTRAMUSCULAR | Status: DC | PRN

## 2021-10-29 MED ORDER — ORAL CARE MOUTH RINSE: 15.0000 mL | Freq: Once | OROMUCOSAL | Status: AC

## 2021-10-29 MED ORDER — GABAPENTIN 100 MG PO CAPS: 100.0000 mg | ORAL_CAPSULE | Freq: Three times a day (TID) | ORAL | Status: DC

## 2021-10-29 MED ORDER — HYDROMORPHONE HCL 1 MG/ML IJ SOLN
0.2000 mg | INTRAMUSCULAR | Status: DC | PRN
  Administered 2021-10-29 (×5): 0.5 mg via INTRAVENOUS
  Administered 2021-10-30: 0.6 mg via INTRAVENOUS
  Administered 2021-10-30: 0.2 mg via INTRAVENOUS
  Filled 2021-10-29 (×5): qty 1

## 2021-10-29 MED ORDER — CHLORHEXIDINE GLUCONATE 0.12 % MT SOLN
15.0000 mL | Freq: Once | OROMUCOSAL | Status: AC
  Administered 2021-10-29: 15 mL via OROMUCOSAL
  Filled 2021-10-29: qty 15

## 2021-10-29 MED ORDER — IBUPROFEN 600 MG PO TABS: 600.0000 mg | ORAL_TABLET | Freq: Four times a day (QID) | ORAL | Status: DC

## 2021-10-29 MED ORDER — ACETAMINOPHEN 500 MG PO TABS
ORAL_TABLET | ORAL | Status: AC
  Filled 2021-10-29: qty 2

## 2021-10-29 MED ORDER — DIPHENHYDRAMINE HCL 50 MG/ML IJ SOLN
12.5000 mg | Freq: Once | INTRAMUSCULAR | Status: AC
  Administered 2021-10-29: 12.5 mg via INTRAVENOUS

## 2021-10-29 SURGICAL SUPPLY — 24 items
CANISTER SUCT 3000ML PPV (MISCELLANEOUS) ×2 IMPLANT
GAUZE 4X4 16PLY ~~LOC~~+RFID DBL (SPONGE) ×2 IMPLANT
GLOVE SURG LTX SZ7 (GLOVE) ×4 IMPLANT
GLOVE SURG UNDER POLY LF SZ6.5 (GLOVE) ×2 IMPLANT
GLOVE SURG UNDER POLY LF SZ7 (GLOVE) ×4 IMPLANT
GOWN STRL REUS W/ TWL LRG LVL3 (GOWN DISPOSABLE) ×5 IMPLANT
GOWN STRL REUS W/TWL LRG LVL3 (GOWN DISPOSABLE) ×4
GOWN STRL REUS W/TWL XL LVL3 (GOWN DISPOSABLE) ×2 IMPLANT
KIT TURNOVER KIT B (KITS) ×2 IMPLANT
NDL SPNL 18GX3.5 QUINCKE PK (NEEDLE) ×1 IMPLANT
NEEDLE SPNL 18GX3.5 QUINCKE PK (NEEDLE) ×2 IMPLANT
NS IRRIG 1000ML POUR BTL (IV SOLUTION) ×2 IMPLANT
PACK VAGINAL WOMENS (CUSTOM PROCEDURE TRAY) ×2 IMPLANT
PAD OB MATERNITY 4.3X12.25 (PERSONAL CARE ITEMS) ×2 IMPLANT
SPONGE T-LAP 4X18 ~~LOC~~+RFID (SPONGE) ×1 IMPLANT
SUT VIC AB 0 CT1 18XCR BRD8 (SUTURE) ×3 IMPLANT
SUT VIC AB 0 CT1 27 (SUTURE) ×4
SUT VIC AB 0 CT1 27XBRD ANBCTR (SUTURE) ×2 IMPLANT
SUT VIC AB 0 CT1 8-18 (SUTURE) ×6
SUT VICRYL 0 TIES 12 18 (SUTURE) ×2 IMPLANT
SYR 20ML LL LF (SYRINGE) ×2 IMPLANT
TOWEL GREEN STERILE FF (TOWEL DISPOSABLE) ×3 IMPLANT
TRAY FOLEY W/BAG SLVR 14FR (SET/KITS/TRAYS/PACK) ×1 IMPLANT
UNDERPAD 30X36 HEAVY ABSORB (UNDERPADS AND DIAPERS) ×2 IMPLANT

## 2021-10-29 NOTE — Interval H&P Note (Signed)
History and Physical Interval Note:  10/29/2021 7:09 AM  [name]  has presented today for surgery, with the diagnosis of Fibroid.  The various methods of treatment have been discussed with the patient and family. After consideration of risks, benefits and other options for treatment, the patient has consented to  Procedure(s): HYSTERECTOMY VAGINAL WITH SALPINGECTOMY (Bilateral) as a surgical intervention.  The patient's history has been reviewed, patient examined, no change in status, stable for surgery.  I have reviewed the patient's chart and labs.  Questions were answered to the patient's satisfaction.     Donnamae Jude

## 2021-10-29 NOTE — Progress Notes (Signed)
Cardiologist: Dr. Einar Gip

## 2021-10-29 NOTE — Op Note (Signed)
Preoperative diagnosis: abnormal vaginal bleeding  Postoperative diagnosis: Same  Procedure: Transvaginal hysterectomy and bilateral salpingectomy  Surgeon: Standley Dakins. Kennon Rounds, M.D.  Assistant: Arlina Robes, MD An experienced assistant was required given the standard of surgical care given the complexity of the case.  This assistant was needed for exposure, dissection, suctioning, retraction, instrument exchange,  and for overall help during the procedure.  Anesthesia: LMA-,Howze, Leanord Hawking, MD, MD  Findings: small fibroid, small uterus.  Estimated blood loss: 100 cc  Specimen: Uterus and tubes to pathology  Reason for procedure: Patient had long h/o bleeding and painful intercourse and desired definitive treament  Risks of  hysterectomy reviewed.  Risks include but are not limited to bleeding, infection, injury to surrounding structures, including bowel, bladder and ureters, blood clots, and death.  Likelihood of success of surgery is high.   Procedure: Patient was taken to the OR where she was placed in dorsal lithotomy in [place]. She was prepped and draped in the usual sterile fashion. A timeout was performed. The patient received 2 g of Ancef prior to procedure. The patient had SCDs in place.  A speculum was placed inside the vagina. The cervix was visualized and grasped with 1 single and 1 double-tooth tenacula. 20 cc of 1% lidocaine with epinephrine were injected paracervically. A knife was used to make a circumferential incision around the vagina. An opened sponge was used to dissect the vagina off the cervix. The posterior peritoneum was entered sharply with Mayo scissors. The posterior peritoneum was tagged to the vaginal cuff with a single stitch. The bladder dissected upward. A Heaney clamp was used to clamp first the left uterosacral ligament and cardinal which was then cut and Haney suture ligated with 0 Vicryl stitch, the stitch was held. Similarly the right uterosacral  ligament was clamped cut and suture ligated. The anterior peritoneal cavity was entered sharply with careful dissection of the bladder off the underlying cervix. Sequential bites up the broad to the uterine arteries were taken until the tubo-ovarian pedicles were encountered. The uterus was then inverted and the left utero-ovarian pedicle grasped with a Heaney clamp. The right utero-ovarian pedicle was similarly grasped with the Heaney clamp. The right and left ovaries were easily visualized. The left tube were grasped and a Kelly clamp placed behind this. Tube removed and pedicle tied with free tie. The right tube was grasped and a Kelly clamp used to clamp behind this. The tube was removed and the pedicle ligated with a free tie. Inspection of all pedicles revealed some bleeding on the right and a figure of eight placed here. There was some bleeding noted at the vaginal cuff on the right side. Other pedicles inspected and hemostatic. The vagina was closed with 0 Vicryl suture in a locked running fashion with care taken to incorporate the uterosacral pedicles. Excellent hemostasis was noted at the end of the case. The vaginal cuff was inspected there was minimal bleeding noted.  A Foley catheter was present inside her bladder. Clear, yellow urine was noted. All instrument needle and lap counts were correct x 2. Patient was awakened taken to recovery room in stable condition.  Donnamae Jude, MD 10/29/2021, 8:32 AM

## 2021-10-29 NOTE — Transfer of Care (Signed)
Immediate Anesthesia Transfer of Care Note  Patient: [name]  Procedure(s) Performed: HYSTERECTOMY VAGINAL WITH BILATERAL SALPINGECTOMY (Bilateral: Uterus)  Patient Location: PACU  Anesthesia Type:General  Level of Consciousness: drowsy and patient cooperative  Airway & Oxygen Therapy: Patient Spontanous Breathing and Patient connected to nasal cannula oxygen  Post-op Assessment: Report given to RN, Post -op Vital signs reviewed and stable and Patient moving all extremities  Post vital signs: Reviewed and stable  Last Vitals:  Vitals Value Taken Time  BP 110/68 10/29/21 0844  Temp    Pulse 77 10/29/21 0847  Resp 11 10/29/21 0847  SpO2 96 % 10/29/21 0847  Vitals shown include unvalidated device data.  Last Pain:  Vitals:   10/29/21 0630  TempSrc: Oral  PainSc:          Complications: No notable events documented.

## 2021-10-29 NOTE — Anesthesia Postprocedure Evaluation (Signed)
Anesthesia Post Note  Patient: [name]  Procedure(s) Performed: HYSTERECTOMY VAGINAL WITH BILATERAL SALPINGECTOMY (Bilateral: Uterus)     Patient location during evaluation: PACU Anesthesia Type: General Level of consciousness: awake and alert Pain management: pain level controlled Vital Signs Assessment: post-procedure vital signs reviewed and stable Respiratory status: spontaneous breathing, nonlabored ventilation and respiratory function stable Cardiovascular status: blood pressure returned to baseline Postop Assessment: no apparent nausea or vomiting Anesthetic complications: no   No notable events documented.  Last Vitals:  Vitals:   10/29/21 1000 10/29/21 1030  BP: 122/62 121/81  Pulse: 78 65  Resp: 11 16  Temp: (!) 36.4 C   SpO2: 97% 98%    Last Pain:  Vitals:   10/29/21 1000  TempSrc:   PainSc: [place]

## 2021-10-29 NOTE — Anesthesia Procedure Notes (Signed)
Procedure Name: LMA Insertion Date/Time: 10/29/2021 7:30 AM Performed by: Amadeo Garnet, CRNA Pre-anesthesia Checklist: Patient identified, Emergency Drugs available, Suction available and Patient being monitored Patient Re-evaluated:Patient Re-evaluated prior to induction Oxygen Delivery Method: Circle system utilized Preoxygenation: Pre-oxygenation with 100% oxygen Induction Type: IV induction Ventilation: Mask ventilation without difficulty LMA: LMA inserted LMA Size: 4.0 Placement Confirmation: positive ETCO2 Dental Injury: Teeth and Oropharynx as per pre-operative assessment

## 2021-10-30 ENCOUNTER — Encounter (HOSPITAL_COMMUNITY): Payer: Self-pay | Admitting: Family Medicine

## 2021-10-30 DIAGNOSIS — Z87891 Personal history of nicotine dependence: Secondary | ICD-10-CM | POA: Diagnosis not present

## 2021-10-30 DIAGNOSIS — N939 Abnormal uterine and vaginal bleeding, unspecified: Secondary | ICD-10-CM | POA: Diagnosis not present

## 2021-10-30 DIAGNOSIS — N736 Female pelvic peritoneal adhesions (postinfective): Secondary | ICD-10-CM | POA: Diagnosis not present

## 2021-10-30 DIAGNOSIS — D251 Intramural leiomyoma of uterus: Secondary | ICD-10-CM | POA: Diagnosis not present

## 2021-10-30 DIAGNOSIS — N920 Excessive and frequent menstruation with regular cycle: Secondary | ICD-10-CM | POA: Diagnosis not present

## 2021-10-30 LAB — CBC
HCT: 33.2 % — ABNORMAL LOW (ref 36.0–46.0)
Hemoglobin: 11.3 g/dL — ABNORMAL LOW (ref 12.0–15.0)
MCH: 30.9 pg (ref 26.0–34.0)
MCHC: 34 g/dL (ref 30.0–36.0)
MCV: 90.7 fL (ref 80.0–100.0)
Platelets: 224 10*3/uL (ref 150–400)
RBC: 3.66 MIL/uL — ABNORMAL LOW (ref 3.87–5.11)
RDW: 11.5 % (ref 11.5–15.5)
WBC: 9.6 10*3/uL (ref 4.0–10.5)
nRBC: 0 % (ref 0.0–0.2)

## 2021-10-30 LAB — SURGICAL PATHOLOGY

## 2021-10-30 NOTE — Plan of Care (Signed)
Patient to be discharged with printed instructions. Tripton Ned L Belissa Kooy, RN  

## 2021-10-30 NOTE — Discharge Summary (Signed)
Physician Discharge Summary  Patient ID: [name] MRN: [id number] DOB/AGE: [birth year] 46 y.o.  Admit date: 10/29/2021 Discharge date:    Admission Diagnoses:  Principal Problem:   Menorrhagia Active Problems:   Chronic endometritis   Menorrhagia with regular cycle   Status post hysterectomy   Discharge Diagnoses:  Same  Past Medical History:  Diagnosis Date   Allergy to alpha-gal    Anxiety and depression    Chronic endometritis    History of COVID-19    History of kidney stones    Migraine headache with aura    Orthostatic hypotension    Pancreatitis, acute    Pneumonia    Syncope    TMJ (temporomandibular joint syndrome)     Surgeries: Procedure(s): HYSTERECTOMY VAGINAL WITH BILATERAL SALPINGECTOMY on 10/29/2021   Consultants:   Discharged Condition: Improved  Hospital Course: [name] is an 46 y.o. female [phone] who was admitted 10/29/2021 with a chief complaint of No chief complaint on file. , and found to have a diagnosis of Menorrhagia.  They were brought to the operating room on 10/29/2021 and underwent the above named procedures.    She was given perioperative antibiotics:  Anti-infectives (From admission, onward)    Start     Dose/Rate Route Frequency Ordered Stop   10/29/21 0600  ceFAZolin (ANCEF) IVPB 2g/100 mL premix        2 g 200 mL/hr over 30 Minutes Intravenous On call to O.R. 10/29/21 8403 10/29/21 0751     .   She was given sequential compression devices, early ambulation.  She had an allergic reaction to lactated ringer's and this fluid was stopped. Pharmacy helped to eliminate any meds/fluids that might cause similar due to alpha-gal allergy.   She benefited maximally from their hospital stay and there were no complications. She was ambulating, voiding, tolerating po and deemed stable for discharge.  Recent vital signs:  Vitals:   10/29/21 2313 10/30/21 0434  BP: (!) 96/52 (!) 91/56  Pulse: (!) 58 (!) 58  Resp: 16  16  Temp: 98.2 F (36.8 C) 98.3 F (36.8 C)  SpO2: 98% 98%   Discharge exam: Physical Examination: General appearance - alert, well appearing, and in no distress Chest - normal effort Heart - normal rate and regular rhythm Abdomen - soft, appropriately tender, dressing is clean and dry Extremities - peripheral pulses normal, no pedal edema, no clubbing or cyanosis, Homan's sign negative bilaterally Recent laboratory studies:  Results for orders placed or performed during the hospital encounter of 10/29/21  CBC  Result Value Ref Range   WBC 9.6 4.0 - 10.5 K/uL   RBC 3.66 (L) 3.87 - 5.11 MIL/uL   Hemoglobin 11.3 (L) 12.0 - 15.0 g/dL   HCT 97.9 (L) 53.6 - 92.2 %   MCV 90.7 80.0 - 100.0 fL   MCH 30.9 26.0 - 34.0 pg   MCHC 34.0 30.0 - 36.0 g/dL   RDW 30.0 97.9 - 49.9 %   Platelets 224 150 - 400 K/uL   nRBC 0.0 0.0 - 0.2 %  Pregnancy, urine POC  Result Value Ref Range   Preg Test, Ur NEGATIVE NEGATIVE  ABO/Rh  Result Value Ref Range   ABO/RH(D)      O NEG Performed at [facility], [address] [place], [place] [address]     Discharge Medications:   Allergies as of 10/30/2021       Reactions   Bee Venom Itching, Shortness Of Breath, Swelling  Meat [alpha-gal] Anaphylaxis   Influenza Vaccines Hives, Rash   Beef-derived Products    Severe stomach cramping, diarrhea   Buspirone Diarrhea, Other (See Comments)   Codeine Nausea And Vomiting   Pt states it is the mix of the codeine with tylenol-3 that causes the nausea and vomiting    Glycerin Other (See Comments)   Patient has alpha gal and is very sensitive to anything that has ingredient that is beef/pork derived product; reactions vary depending on exposure   Heparin Other (See Comments)   Patient has alpha gal and is very sensitive to anything that has ingredient that is beef/pork derived product; reactions vary depending on exposure   Lactated Ringers Other (See Comments)   Patient has alpha gal and is very  sensitive to anything that has ingredient that is beef/pork derived product; reactions vary depending on exposure   Lactic Acid Other (See Comments)   Patient has alpha gal and is very sensitive to anything that has ingredient that is beef/pork derived product; reactions vary depending on exposure   Lactose Intolerance (gi) Other (See Comments)   Patient has alpha gal and is very sensitive to anything that has ingredient that is beef/pork derived product; reactions vary depending on exposure   Lactose Monohydrate [lactose] Other (See Comments)   Patient has alpha gal and is very sensitive to anything that has ingredient that is beef/pork derived product; reactions vary depending on exposure   Latex    Macrobid [nitrofurantoin Macrocrystal] Itching   Severe yeast   Magnesium Nausea And Vomiting   Magnesium Stearate Other (See Comments)   Patient has alpha gal and is very sensitive to anything that has ingredient that is beef -derived product    Other Other (See Comments)   Dissolvable sutures made from animal derived products-  Patient has alpha gal and is very sensitive to anything that has ingredient that is beef/pork derived product; reactions vary depending on exposure   Prednisone    Stearic Acid Other (See Comments)   Patient has alpha gal and is very sensitive to anything that has ingredient that is beef/pork derived product; reactions vary depending on exposure        Medication List     TAKE these medications    acyclovir ointment 5 % Commonly known as: ZOVIRAX Apply 1 application topically every 4 (four) hours as needed.   albuterol 108 (90 Base) MCG/ACT inhaler Commonly known as: VENTOLIN HFA 1-2 puffs every 6 (six) hours as needed for shortness of breath or wheezing.   ALPRAZolam 0.25 MG tablet Commonly known as: XANAX Take 1 tablet (0.25 mg total) by mouth 3 (three) times daily as needed.   BIOTIN PO Take 1 tablet by mouth daily.   budesonide-formoterol 160-4.5  MCG/ACT inhaler Commonly known as: SYMBICORT 2 puffs daily as needed (shortness of breath).   Carestart COVID-19 Home Test Kit Generic drug: COVID-19 At Home Antigen Test Use as directed within package instructions.   cetirizine 10 MG tablet Commonly known as: ZYRTEC Take 10 mg by mouth daily.   EPINEPHrine 0.3 mg/0.3 mL Soaj injection Commonly known as: EPI-PEN Use as directed for life-threatening allergic reaction.   multivitamin with minerals Tabs tablet Take 1 tablet by mouth daily.   oxyCODONE-acetaminophen 5-325 MG tablet Commonly known as: PERCOCET/ROXICET Take 1-2 tablets by mouth every 6 (six) hours as needed.   pyridOXINE 50 MG tablet Commonly known as: VITAMIN B-6 Take 50 mg by mouth in the morning and at bedtime.   vitamin  B-12 1000 MCG tablet Commonly known as: CYANOCOBALAMIN Take 1,000 mcg by mouth in the morning and at bedtime.        Diagnostic Studies: US BREAST LTD UNI LEFT INC AXILLA  Result Date: 10/21/2021 CLINICAL DATA:  Callback from screening mammogram for possible masses in both breasts. EXAM: DIGITAL DIAGNOSTIC BILATERAL MAMMOGRAM WITH TOMOSYNTHESIS AND CAD; ULTRASOUND RIGHT BREAST LIMITED; ULTRASOUND LEFT BREAST LIMITED TECHNIQUE: Bilateral digital diagnostic mammography and breast tomosynthesis was performed. The images were evaluated with computer-aided detection.; Targeted ultrasound examination of the right breast was performed; Targeted ultrasound examination of the left breast was performed. COMPARISON:  Prior films ACR Breast Density Category c: The breast tissue is heterogeneously dense, which may obscure small masses. FINDINGS: Spot compression cc and MLO views of bilateral breasts are submitted. The previously noted group of masses in the right breast 6 o'clock is persistent. The previously noted mass in the lateral posterior left breast is persistent. The previously noted mass in the medial posterior left breast is persistent. Targeted  ultrasound is performed, showing a cluster of cysts at the right breast 6 o'clock 4 cm from nipple measuring 2.3 x 0.7 x 1.1 cm in combined dimension correlating to the mammographic mass. Ultrasound of the left breast demonstrates a 7 x 5 x 6 mm simple cyst at the left breast 2 o'clock 4 cm from nipple correlating to the lateral mass noted on mammogram. At the left breast 8:30 o'clock 3 cm from nipple, there is a group of simple cyst measuring 7 mm combined dimension. At the left breast 10 o'clock 4 cm from nipple, 8 x 5 x 10 mm in dimension. One of these groups of cysts correlate to the medial mass seen on mammogram. IMPRESSION: Benign findings. RECOMMENDATION: Routine screening mammogram back on schedule. I have discussed the findings and recommendations with the patient. If applicable, a reminder letter will be sent to the patient regarding the next appointment. BI-RADS CATEGORY  2: Benign. Electronically Signed   By: Abelardo Diesel M.D.   On: 10/21/2021 15:22  US BREAST LTD UNI RIGHT INC AXILLA  Result Date: 10/21/2021 CLINICAL DATA:  Callback from screening mammogram for possible masses in both breasts. EXAM: DIGITAL DIAGNOSTIC BILATERAL MAMMOGRAM WITH TOMOSYNTHESIS AND CAD; ULTRASOUND RIGHT BREAST LIMITED; ULTRASOUND LEFT BREAST LIMITED TECHNIQUE: Bilateral digital diagnostic mammography and breast tomosynthesis was performed. The images were evaluated with computer-aided detection.; Targeted ultrasound examination of the right breast was performed; Targeted ultrasound examination of the left breast was performed. COMPARISON:  Prior films ACR Breast Density Category c: The breast tissue is heterogeneously dense, which may obscure small masses. FINDINGS: Spot compression cc and MLO views of bilateral breasts are submitted. The previously noted group of masses in the right breast 6 o'clock is persistent. The previously noted mass in the lateral posterior left breast is persistent. The previously noted mass in  the medial posterior left breast is persistent. Targeted ultrasound is performed, showing a cluster of cysts at the right breast 6 o'clock 4 cm from nipple measuring 2.3 x 0.7 x 1.1 cm in combined dimension correlating to the mammographic mass. Ultrasound of the left breast demonstrates a 7 x 5 x 6 mm simple cyst at the left breast 2 o'clock 4 cm from nipple correlating to the lateral mass noted on mammogram. At the left breast 8:30 o'clock 3 cm from nipple, there is a group of simple cyst measuring 7 mm combined dimension. At the left breast 10 o'clock 4 cm from nipple, 8 x 5  x 10 mm in dimension. One of these groups of cysts correlate to the medial mass seen on mammogram. IMPRESSION: Benign findings. RECOMMENDATION: Routine screening mammogram back on schedule. I have discussed the findings and recommendations with the patient. If applicable, a reminder letter will be sent to the patient regarding the next appointment. BI-RADS CATEGORY  2: Benign. Electronically Signed   By: Abelardo Diesel M.D.   On: 10/21/2021 15:22  MM DIAG BREAST TOMO BILATERAL  Result Date: 10/21/2021 CLINICAL DATA:  Callback from screening mammogram for possible masses in both breasts. EXAM: DIGITAL DIAGNOSTIC BILATERAL MAMMOGRAM WITH TOMOSYNTHESIS AND CAD; ULTRASOUND RIGHT BREAST LIMITED; ULTRASOUND LEFT BREAST LIMITED TECHNIQUE: Bilateral digital diagnostic mammography and breast tomosynthesis was performed. The images were evaluated with computer-aided detection.; Targeted ultrasound examination of the right breast was performed; Targeted ultrasound examination of the left breast was performed. COMPARISON:  Prior films ACR Breast Density Category c: The breast tissue is heterogeneously dense, which may obscure small masses. FINDINGS: Spot compression cc and MLO views of bilateral breasts are submitted. The previously noted group of masses in the right breast 6 o'clock is persistent. The previously noted mass in the lateral posterior  left breast is persistent. The previously noted mass in the medial posterior left breast is persistent. Targeted ultrasound is performed, showing a cluster of cysts at the right breast 6 o'clock 4 cm from nipple measuring 2.3 x 0.7 x 1.1 cm in combined dimension correlating to the mammographic mass. Ultrasound of the left breast demonstrates a 7 x 5 x 6 mm simple cyst at the left breast 2 o'clock 4 cm from nipple correlating to the lateral mass noted on mammogram. At the left breast 8:30 o'clock 3 cm from nipple, there is a group of simple cyst measuring 7 mm combined dimension. At the left breast 10 o'clock 4 cm from nipple, 8 x 5 x 10 mm in dimension. One of these groups of cysts correlate to the medial mass seen on mammogram. IMPRESSION: Benign findings. RECOMMENDATION: Routine screening mammogram back on schedule. I have discussed the findings and recommendations with the patient. If applicable, a reminder letter will be sent to the patient regarding the next appointment. BI-RADS CATEGORY  2: Benign. Electronically Signed   By: Abelardo Diesel M.D.   On: 10/21/2021 15:22  MM 3D SCREEN BREAST BILATERAL  Result Date: 10/17/2021 CLINICAL DATA:  Screening. EXAM: DIGITAL SCREENING BILATERAL MAMMOGRAM WITH TOMOSYNTHESIS AND CAD TECHNIQUE: Bilateral screening digital craniocaudal and mediolateral oblique mammograms were obtained. Bilateral screening digital breast tomosynthesis was performed. The images were evaluated with computer-aided detection. COMPARISON:  Previous exam(s). ACR Breast Density Category d: The breast tissue is extremely dense, which lowers the sensitivity of mammography. FINDINGS: In the right breast masses require further evaluation. In the left breast masses require further evaluation. IMPRESSION: Further evaluation is suggested for possible masses in the right breast. Further evaluation is suggested for possible masses in the left breast. RECOMMENDATION: Diagnostic mammogram and possibly  ultrasound of both breasts. (Code:FI-B-10M) The patient will be contacted regarding the findings, and additional imaging will be scheduled. BI-RADS CATEGORY  0: Incomplete. Need additional imaging evaluation and/or prior mammograms for comparison. Electronically Signed   By: Lovey Newcomer M.D.   On: 10/17/2021 13:17    Disposition: Discharge disposition: 01-Home or Self Care       Discharge Instructions      Remove dressing in 72 hours   Complete by: As directed    Call MD for:  persistant nausea and  vomiting   Complete by: As directed    Call MD for:  redness, tenderness, or signs of infection (pain, swelling, redness, odor or green/yellow discharge around incision site)   Complete by: As directed    Call MD for:  severe uncontrolled pain   Complete by: As directed    Call MD for:  temperature >100.4   Complete by: As directed    Diet - low sodium heart healthy   Complete by: As directed    Discharge patient   Complete by: As directed    Discharge disposition: 01-Home or Self Care   Discharge patient date: 10/30/2021   Driving Restrictions   Complete by: As directed    None while taking narcotic pain meds   Increase activity slowly   Complete by: As directed    Lifting restrictions   Complete by: As directed    Nothing > 20 lbs x 6 weeks   Sexual Activity Restrictions   Complete by: As directed    None x 6 weeks        Follow-up [place] at [facility] Follow up in 2 week(s).   Specialty: Obstetrics and Gynecology Why: postop check, they will call you with an appointment Contact information: [place] [phone] [phone]                 Signed: Donnamae Jude 10/30/2021, 7:31 AM

## 2021-11-04 ENCOUNTER — Encounter: Payer: Self-pay | Admitting: Family Medicine

## 2021-11-21 ENCOUNTER — Ambulatory Visit (INDEPENDENT_AMBULATORY_CARE_PROVIDER_SITE_OTHER): Payer: 59 | Admitting: Family Medicine

## 2021-11-21 ENCOUNTER — Encounter: Payer: Self-pay | Admitting: Family Medicine

## 2021-11-21 ENCOUNTER — Other Ambulatory Visit: Payer: Self-pay

## 2021-11-21 VITALS — BP 103/70 | HR 79 | Wt 130.3 lb

## 2021-11-21 DIAGNOSIS — Z90710 Acquired absence of both cervix and uterus: Secondary | ICD-10-CM

## 2021-11-21 NOTE — Progress Notes (Signed)
° °  Subjective:    Patient ID: [name] is a 46 y.o. female presenting with No chief complaint on file.  on 11/21/2021  HPI: Patient is s/p TVH with bilateral salpingectomy on 10/29/2021. No ever chills, no nausea vomiting. Urine and bowel function is normal.  Review of Systems  Constitutional:  Negative for chills and fever.  Respiratory:  Negative for shortness of breath.   Cardiovascular:  Negative for chest pain.  Gastrointestinal:  Negative for abdominal pain, nausea and vomiting.  Genitourinary:  Negative for dysuria.  Skin:  Negative for rash.     Objective:    BP 103/70    Pulse 79    Wt 130 lb 4.8 oz (59.1 kg)    LMP 10/09/2021 (Approximate) Comment: tubes tied. hysterctomy scheduled 1.31.23   BMI 20.41 kg/m  Physical Exam Exam conducted with a chaperone present.  Constitutional:      General: She is not in acute distress.    Appearance: She is well-developed.  HENT:     Head: Normocephalic and atraumatic.  Eyes:     General: No scleral icterus. Cardiovascular:     Rate and Rhythm: Normal rate.  Pulmonary:     Effort: Pulmonary effort is normal.  Abdominal:     Palpations: Abdomen is soft.  Musculoskeletal:     Cervical back: Neck supple.  Skin:    General: Skin is warm and dry.  Neurological:     Mental Status: She is alert and oriented to person, place, and time.        Assessment & Plan:  Status post hysterectomy - Doing well. ususal course, precautions reviewed.   Return in about 4 weeks (around 12/19/2021) for a follow-up, in person.  Donnamae Jude, MD 11/21/2021 10:47 AM

## 2021-12-09 ENCOUNTER — Encounter: Payer: Self-pay | Admitting: Family Medicine

## 2021-12-19 ENCOUNTER — Ambulatory Visit (INDEPENDENT_AMBULATORY_CARE_PROVIDER_SITE_OTHER): Payer: 59 | Admitting: Family Medicine

## 2021-12-19 ENCOUNTER — Other Ambulatory Visit: Payer: Self-pay

## 2021-12-19 ENCOUNTER — Encounter: Payer: Self-pay | Admitting: Family Medicine

## 2021-12-19 VITALS — BP 117/79 | HR 82 | Wt 125.7 lb

## 2021-12-19 DIAGNOSIS — Z90710 Acquired absence of both cervix and uterus: Secondary | ICD-10-CM

## 2021-12-19 DIAGNOSIS — Z4889 Encounter for other specified surgical aftercare: Secondary | ICD-10-CM

## 2021-12-19 NOTE — Progress Notes (Signed)
? ?  Subjective:  ? ? Patient ID: [name] is a 46 y.o. female presenting with Follow-up ? on 12/19/2021 ? ?HPI: ?Patient is s/p TVH with bilateral salpingectomy on 10/29/2021. ?No ever chills, no nausea vomiting. ?Urine and bowel function is normal. Reports some vaginal  discharge which seems to be improving. ? ?Review of Systems  ?Constitutional:  Negative for chills and fever.  ?Respiratory:  Negative for shortness of breath.   ?Cardiovascular:  Negative for chest pain.  ?Gastrointestinal:  Negative for abdominal pain, nausea and vomiting.  ?Genitourinary:  Positive for vaginal discharge. Negative for dysuria.  ?Skin:  Negative for rash.  ?   ?Objective:  ?  ?BP 117/79   Pulse 82   Wt 125 lb 11.2 oz (57 kg)   LMP 10/09/2021 (Approximate) Comment: tubes tied. hysterctomy scheduled 1.31.23  BMI 19.69 kg/m?  ?Physical Exam ?Exam conducted with a chaperone present.  ?Constitutional:   ?   General: She is not in acute distress. ?   Appearance: She is well-developed.  ?HENT:  ?   Head: Normocephalic and atraumatic.  ?Eyes:  ?   General: No scleral icterus. ?Cardiovascular:  ?   Rate and Rhythm: Normal rate.  ?Pulmonary:  ?   Effort: Pulmonary effort is normal.  ?Abdominal:  ?   Palpations: Abdomen is soft.  ?Genitourinary: ?   General: Normal vulva.  ?   Vagina: No vaginal discharge, tenderness or lesions.  ?Musculoskeletal:  ?   Cervical back: Neck supple.  ?Skin: ?   General: Skin is warm and dry.  ?Neurological:  ?   Mental Status: She is alert and oriented to person, place, and time.  ? ? ? ?   ?Assessment & Plan:  ? ?Problem List Items Addressed This Visit   ? ?  ? Unprioritized  ? Status post hysterectomy  ?  Ok to resume normal activity, slowly ?  ?  ? ?Other Visit Diagnoses   ? ? Encounter for postoperative care    -  Primary  ? ?  ? ? ?No follow-ups on file. ? ?Donnamae Jude, MD ?12/19/2021 ?3:45 PM ? ? ?

## 2021-12-19 NOTE — Assessment & Plan Note (Signed)
Ok to resume normal activity, slowly ?

## 2022-01-02 DIAGNOSIS — B029 Zoster without complications: Secondary | ICD-10-CM | POA: Diagnosis not present

## 2022-01-02 DIAGNOSIS — Z681 Body mass index (BMI) 19 or less, adult: Secondary | ICD-10-CM | POA: Diagnosis not present

## 2022-01-07 ENCOUNTER — Other Ambulatory Visit (HOSPITAL_COMMUNITY): Payer: Self-pay

## 2022-01-07 DIAGNOSIS — B029 Zoster without complications: Secondary | ICD-10-CM | POA: Diagnosis not present

## 2022-01-07 DIAGNOSIS — Z6820 Body mass index (BMI) 20.0-20.9, adult: Secondary | ICD-10-CM | POA: Diagnosis not present

## 2022-01-07 MED ORDER — GABAPENTIN 50 MG PO TABS: 50.0000 mg | ORAL_TABLET | Freq: Every day | ORAL | 1 refills | Status: DC

## 2022-01-29 ENCOUNTER — Telehealth: Payer: Self-pay | Admitting: Allergy and Immunology

## 2022-01-29 NOTE — Telephone Encounter (Signed)
Patient states she was stung by a bee yesterday afternoon and had to take Epi due to having a  troubled time breathing. She is also always itching and has a rash on her chest, neck and face. She is wondering if there is a non drowsy medication that she can take to help with the itching throughout the day.  ?

## 2022-01-29 NOTE — Telephone Encounter (Signed)
Please advise 

## 2022-01-29 NOTE — Telephone Encounter (Signed)
Please inform [name] that she can use cetirizine up to 40 mg a day with 1-2 tablets 1-2 times per day if this is required for itchiness.  She should consider starting a course of immunotherapy directed against stinging insect venom. ?

## 2022-01-29 NOTE — Telephone Encounter (Signed)
Left a message to call back.

## 2022-01-30 NOTE — Telephone Encounter (Signed)
Called and informed patient of Dr. Kozlow's message.  

## 2022-03-07 DIAGNOSIS — J0100 Acute maxillary sinusitis, unspecified: Secondary | ICD-10-CM | POA: Diagnosis not present

## 2022-03-07 DIAGNOSIS — Z6820 Body mass index (BMI) 20.0-20.9, adult: Secondary | ICD-10-CM | POA: Diagnosis not present

## 2022-04-23 DIAGNOSIS — G43019 Migraine without aura, intractable, without status migrainosus: Secondary | ICD-10-CM | POA: Diagnosis not present

## 2022-04-28 DIAGNOSIS — G43009 Migraine without aura, not intractable, without status migrainosus: Secondary | ICD-10-CM | POA: Diagnosis not present

## 2022-04-28 DIAGNOSIS — F411 Generalized anxiety disorder: Secondary | ICD-10-CM | POA: Diagnosis not present

## 2022-04-28 DIAGNOSIS — Z6820 Body mass index (BMI) 20.0-20.9, adult: Secondary | ICD-10-CM | POA: Diagnosis not present

## 2022-05-22 ENCOUNTER — Other Ambulatory Visit (HOSPITAL_COMMUNITY): Payer: Self-pay

## 2022-05-22 DIAGNOSIS — G43009 Migraine without aura, not intractable, without status migrainosus: Secondary | ICD-10-CM | POA: Diagnosis not present

## 2022-05-22 DIAGNOSIS — Z6821 Body mass index (BMI) 21.0-21.9, adult: Secondary | ICD-10-CM | POA: Diagnosis not present

## 2022-05-22 DIAGNOSIS — F411 Generalized anxiety disorder: Secondary | ICD-10-CM | POA: Diagnosis not present

## 2022-05-22 MED ORDER — TRINTELLIX 10 MG PO TABS
10.0000 mg | ORAL_TABLET | Freq: Every day | ORAL | 1 refills | Status: DC
  Filled 2022-05-22: qty 90, 90d supply, fill #0
  Filled 2022-08-19: qty 90, 90d supply, fill #1

## 2022-05-23 ENCOUNTER — Other Ambulatory Visit (HOSPITAL_COMMUNITY): Payer: Self-pay

## 2022-06-13 DIAGNOSIS — R300 Dysuria: Secondary | ICD-10-CM | POA: Diagnosis not present

## 2022-06-13 DIAGNOSIS — Z6820 Body mass index (BMI) 20.0-20.9, adult: Secondary | ICD-10-CM | POA: Diagnosis not present

## 2022-06-24 ENCOUNTER — Encounter: Payer: Self-pay | Admitting: Allergy

## 2022-06-24 ENCOUNTER — Ambulatory Visit: Payer: 59 | Admitting: Allergy

## 2022-06-24 ENCOUNTER — Telehealth: Payer: Self-pay | Admitting: Allergy and Immunology

## 2022-06-24 VITALS — BP 98/62 | HR 69 | Resp 16 | Ht 67.0 in | Wt 128.8 lb

## 2022-06-24 DIAGNOSIS — T783XXD Angioneurotic edema, subsequent encounter: Secondary | ICD-10-CM

## 2022-06-24 DIAGNOSIS — L508 Other urticaria: Secondary | ICD-10-CM

## 2022-06-24 MED ORDER — PREDNISOLONE 15 MG/5ML PO SOLN: ORAL | 0 refills | Status: DC

## 2022-06-24 NOTE — Progress Notes (Signed)
  Follow-up Note  RE: [name] MRN: [id number] DOB: 02/08/1976 Date of Office Visit: 06/24/2022   History of present illness: [name] is a 46 y.o. female presenting today for sick visit for hive outbreak.  She was last seen in the office on 08/19/2021 by Dr. Kozlow.  She has history of food allergy to alpha gal as well as hymenoptera allergy.  She also has allergic rhinitis.  She states she has not had hives to this degree before.  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  She states the other foot started to itch and also became swollen.  The hives she states started and seems to be spreading up the body.  She is very itchy and miserable at this time.  She states the hives are now on her arms and chest and pretty much all over.  She cannot really identify anything that may have triggered this other than a potential work exposure.  She does report she is allergic to latex.  She also states she has been trying to stay cool as this seems to help.  She has not had any sting's.  She has not had any food exposures to mammal meat.  She did however have a kidney infection she states 2 weeks ago that was treated with Bactrim which she has completed.  She does state the Bactrim caused her to have some GI symptoms including vomiting.  Today she is already taking 2 Zyrtec's and children's dose of Benadryl.  She states her regular Zyrtec dosing is 1 tablet twice a day.  She states she has had more stress and has not been able to take her anti-anxiety medications which isn't helpgin.  She states she cannot tolerate prednisone pills as she states it is made with lactose.  She states she can tolerate liquid prednisone.  Review of systems: Review of Systems  Constitutional: Negative.   HENT: Negative.    Eyes: Negative.   Respiratory: Negative.    Cardiovascular:  Negative.   Gastrointestinal: Negative.   Musculoskeletal: Negative.   Skin:  Positive for rash.  Allergic/Immunologic: Negative.   Neurological: Negative.      All other systems negative unless noted above in HPI  Past medical/social/surgical/family history have been reviewed and are unchanged unless specifically indicated below.  No changes  Medication List: Current Outpatient Medications  Medication Sig Dispense Refill   albuterol (VENTOLIN HFA) 108 (90 Base) MCG/ACT inhaler 1-2 puffs every 6 (six) hours as needed for shortness of breath or wheezing.     BIOTIN PO Take 1 tablet by mouth daily.     budesonide-formoterol (SYMBICORT) 160-4.5 MCG/ACT inhaler 2 puffs daily as needed (shortness of breath).     cetirizine (ZYRTEC) 10 MG tablet Take 10 mg by mouth daily.     COVID-19 At Home Antigen Test (CARESTART COVID-19 HOME TEST) KIT Use as directed within package instructions. 4 each 0   EPINEPHrine 0.3 mg/0.3 mL IJ SOAJ injection Use as directed for life-threatening allergic reaction. 2 each 3   prednisoLONE (PRELONE) 15 MG/5ML SOLN Take 10 mls this afternoon, then 10 mls twice daily for 1 day then 10 mls daily for 3 days 70 mL 0   vortioxetine HBr (TRINTELLIX) 10 MG TABS tablet Take 1 tablet by mouth daily 90 tablet 1   No current facility-administered medications for   this visit.     Known medication allergies: Allergies  Allergen Reactions   Bee Venom Itching, Shortness Of Breath and Swelling   Meat [Alpha-Gal] Anaphylaxis   Influenza Vaccines Hives and Rash   Beef-Derived Products     Severe stomach cramping, diarrhea   Buspirone Diarrhea and Other (See Comments)   Codeine Nausea And Vomiting    Pt states it is the mix of the codeine with tylenol-3 that causes the nausea and vomiting    Glycerin Other (See Comments)    Patient has alpha gal and is very sensitive to anything that has ingredient that is beef/pork derived product; reactions vary depending on exposure    Heparin Other (See Comments)    Patient has alpha gal and is very sensitive to anything that has ingredient that is beef/pork derived product; reactions vary depending on exposure   Lactated Ringers Other (See Comments)    Patient has alpha gal and is very sensitive to anything that has ingredient that is beef/pork derived product; reactions vary depending on exposure   Lactic Acid Other (See Comments)    Patient has alpha gal and is very sensitive to anything that has ingredient that is beef/pork derived product; reactions vary depending on exposure   Lactose Intolerance (Gi) Other (See Comments)    Patient has alpha gal and is very sensitive to anything that has ingredient that is beef/pork derived product; reactions vary depending on exposure   Lactose Monohydrate [Lactose] Other (See Comments)    Patient has alpha gal and is very sensitive to anything that has ingredient that is beef/pork derived product; reactions vary depending on exposure   Latex    Macrobid [Nitrofurantoin Macrocrystal] Itching    Severe yeast   Magnesium Nausea And Vomiting   Magnesium Stearate Other (See Comments)    Patient has alpha gal and is very sensitive to anything that has ingredient that is beef -derived product    Other Other (See Comments)    Dissolvable sutures made from animal derived products-  Patient has alpha gal and is very sensitive to anything that has ingredient that is beef/pork derived product; reactions vary depending on exposure   Prednisone    Stearic Acid Other (See Comments)    Patient has alpha gal and is very sensitive to anything that has ingredient that is beef/pork derived product; reactions vary depending on exposure     Physical examination: Blood pressure 98/62, pulse 69, resp. rate 16, height 5' 7" (1.702 m), weight 128 lb 12.8 oz (58.4 kg), last menstrual period 10/09/2021, SpO2 98 %.  General: Alert, interactive, uncomforatble, scratching and shifting in chair during  visit HEENT: PERRLA, TMs pearly gray, turbinates non-edematous without discharge, post-pharynx non erythematous. Neck: Supple without lymphadenopathy. Lungs: Clear to auscultation without wheezing, rhonchi or rales. {no increased work of breathing. CV: Normal S1, S2 without murmurs. Abdomen: Nondistended, nontender. Skin: Scattered erythematous urticarial type lesions primarily located legs b/l, both ankles are edematous,  right forearm,  , nonvesicular. Extremities:  No clubbing, cyanosis or edema. Neuro:   Grossly intact.  Diagnositics/Labs: None today  Assessment and plan: Urticaria with angioedema, acute  - at this time etiology of hives and swelling is unknown however with recent infection this may be trigger vs a work exposure.  Hives can be caused by a variety of different triggers including illness/infection, foods, medications, stings, exercise, pressure, vibrations, extremes of temperature to name a few however majority of the time there is no identifiable trigger.   -  recommend you double your antihistamine dose to Zyrtec 2 tabs in morning and 2 Zyrtec at night until hives/swelling resolve then resume your regular twice a day dosing   - take prednisolone 81m/5ml take 171m(3027mtwice a day for 2 days then 44m73mce a day for 3 days then stop.  First dose provided in office    - continue your avoidance of mammal meat/dairy consumption/stinging insects   - continue to have access to your Epipen for allergic reactions and follow emergency action plan  Follow-up in Dr KozlNeldon Mcrecommended next month  I appreciate the opportunity to take part in [place]. Please do not hesitate to contact me with questions.  Sincerely,   ShayPrudy Feeler [facility]

## 2022-06-24 NOTE — Patient Instructions (Addendum)
Hives and swelling  - at this time etiology of hives and swelling is unknown however with recent infection this may be trigger vs a work exposure.  Hives can be caused by a variety of different triggers including illness/infection, foods, medications, stings, exercise, pressure, vibrations, extremes of temperature to name a few however majority of the time there is no identifiable trigger.   - recommend you double your antihistamine dose to Zyrtec 2 tabs in morning and 2 Zyrtec at night until hives/swelling resolve then resume your regular twice a day dosing   - take prednisolone '15mg'$ /28m take 162m('30mg'$ ) twice a day for 2 days then '10mg'$  once a day for 3 days then stop.  First dose provided in office    - continue your avoidance of mammal meat/dairy consumption/stinging insects   - continue to have access to your Epipen for allergic reactions and follow emergency action plan  Follow-up in Dr KoNeldon Mcs recommended next month

## 2022-06-24 NOTE — Telephone Encounter (Signed)
Patient states as of yesterday she broke out in hives all over her legs, chest, ankles, and the back of her hands. The back of her hands have been itching really bad. She took benadryl but it is not working. She is very concerned because she doesn't know where it came from and what she needs to do.

## 2022-06-24 NOTE — Telephone Encounter (Signed)
[name] didn't want to the Prednisone and she said she is already taking Cetirizine twice daily. She requested an appointment today so she is seeing Dr. Nelva Bush.

## 2022-07-10 DIAGNOSIS — F411 Generalized anxiety disorder: Secondary | ICD-10-CM | POA: Diagnosis not present

## 2022-07-10 DIAGNOSIS — N390 Urinary tract infection, site not specified: Secondary | ICD-10-CM | POA: Diagnosis not present

## 2022-07-10 DIAGNOSIS — Z6820 Body mass index (BMI) 20.0-20.9, adult: Secondary | ICD-10-CM | POA: Diagnosis not present

## 2022-07-10 DIAGNOSIS — N3000 Acute cystitis without hematuria: Secondary | ICD-10-CM | POA: Diagnosis not present

## 2022-07-10 DIAGNOSIS — R109 Unspecified abdominal pain: Secondary | ICD-10-CM | POA: Diagnosis not present

## 2022-07-22 DIAGNOSIS — R1032 Left lower quadrant pain: Secondary | ICD-10-CM | POA: Diagnosis not present

## 2022-07-22 DIAGNOSIS — Q625 Duplication of ureter: Secondary | ICD-10-CM | POA: Diagnosis not present

## 2022-07-22 DIAGNOSIS — Z905 Acquired absence of kidney: Secondary | ICD-10-CM | POA: Diagnosis not present

## 2022-07-22 DIAGNOSIS — R109 Unspecified abdominal pain: Secondary | ICD-10-CM | POA: Diagnosis not present

## 2022-07-22 DIAGNOSIS — R1031 Right lower quadrant pain: Secondary | ICD-10-CM | POA: Diagnosis not present

## 2022-07-22 DIAGNOSIS — N200 Calculus of kidney: Secondary | ICD-10-CM | POA: Diagnosis not present

## 2022-07-22 DIAGNOSIS — N398 Other specified disorders of urinary system: Secondary | ICD-10-CM | POA: Diagnosis not present

## 2022-07-22 DIAGNOSIS — Z87440 Personal history of urinary (tract) infections: Secondary | ICD-10-CM | POA: Diagnosis not present

## 2022-07-22 DIAGNOSIS — R338 Other retention of urine: Secondary | ICD-10-CM | POA: Diagnosis not present

## 2022-07-23 ENCOUNTER — Ambulatory Visit: Payer: 59 | Admitting: Allergy and Immunology

## 2022-07-23 ENCOUNTER — Encounter: Payer: Self-pay | Admitting: Allergy and Immunology

## 2022-07-23 VITALS — BP 84/52 | HR 76 | Resp 14

## 2022-07-23 DIAGNOSIS — T63481A Toxic effect of venom of other arthropod, accidental (unintentional), initial encounter: Secondary | ICD-10-CM

## 2022-07-23 DIAGNOSIS — L500 Allergic urticaria: Secondary | ICD-10-CM | POA: Diagnosis not present

## 2022-07-23 DIAGNOSIS — T7800XD Anaphylactic reaction due to unspecified food, subsequent encounter: Secondary | ICD-10-CM

## 2022-07-23 DIAGNOSIS — T7800XA Anaphylactic reaction due to unspecified food, initial encounter: Secondary | ICD-10-CM

## 2022-07-23 DIAGNOSIS — J3089 Other allergic rhinitis: Secondary | ICD-10-CM | POA: Diagnosis not present

## 2022-07-23 DIAGNOSIS — T63481D Toxic effect of venom of other arthropod, accidental (unintentional), subsequent encounter: Secondary | ICD-10-CM

## 2022-07-23 MED ORDER — EPINEPHRINE 0.3 MG/0.3ML IJ SOAJ: INTRAMUSCULAR | 3 refills | Status: DC

## 2022-07-23 NOTE — Patient Instructions (Addendum)
  1.  Allergen avoidance measures - mammal meat / Dairy consumption / hymenoptera  2.  EpiPen, Benadryl, MD/ER evaluation for allergic reaction  3.  Daily cetirizine 10 mg - 1 tablet 1-2 times per day  4.  Can use a low dose of Rhinocort - 1 spray each nostril 2-3 times per week to prevent upper airway inflammation and recurrent sinus symptoms  5. Return to clinic in 1 year or earlier if needed  6.  Consider immunotherapy for hymenoptera allergy and aeroallergen allergy

## 2022-07-23 NOTE — Progress Notes (Signed)
[facility] [place] [facility] [place]   Follow-up Note  Referring Provider: No ref. provider found Primary Provider: Pcp, No Date of Office Visit: 07/23/2022  Subjective:   [name] (DOB: [birth year]) is a 46 y.o. female who returns to the [facility] on 07/23/2022 in re-evaluation of the following:  HPI: Zaylin returns to this clinic in evaluation of alpha gal syndrome, hymenoptera allergy, urticaria, and allergic rhinitis.  I last saw her in this clinic on 19 August 2021.  She last saw Dr. Nelva Bush on 24 June 2022 with an acute outbreak of urticaria that fortunately appeared to have resolved.  She states that she has had more problems with "sinus" this year with lots of nasal congestion and nasal pressure and sinus pressure and sneezing throughout the year.  She remains away from consumption of all mammal.  She states that she has been stung 3 times, twice by a hornet and once by yellowjacket, and on each occasion she has had an allergic reaction with urticaria and occasionally some breathing issues and occasionally she has used an EpiPen.  Allergies as of 07/23/2022       Reactions   Bee Venom Itching, Shortness Of Breath, Swelling   Meat [alpha-gal] Anaphylaxis   Influenza Vaccines Hives, Rash   Bactrim Ds [sulfamethoxazole-trimethoprim] Nausea And Vomiting   Beef-derived Products    Severe stomach cramping, diarrhea   Buspirone Diarrhea, Other (See Comments)   Codeine Nausea And Vomiting   Pt states it is the mix of the codeine with tylenol-3 that causes the nausea and vomiting    Glycerin Other (See Comments)   Patient has alpha gal and is very sensitive to anything that has ingredient that is beef/pork derived product; reactions vary depending on exposure   Heparin Other (See Comments)   Patient has alpha gal and is very sensitive to anything that has ingredient that is beef/pork derived product; reactions vary  depending on exposure   Lactated Ringers Other (See Comments)   Patient has alpha gal and is very sensitive to anything that has ingredient that is beef/pork derived product; reactions vary depending on exposure   Lactic Acid Other (See Comments)   Patient has alpha gal and is very sensitive to anything that has ingredient that is beef/pork derived product; reactions vary depending on exposure   Lactose Intolerance (gi) Other (See Comments)   Patient has alpha gal and is very sensitive to anything that has ingredient that is beef/pork derived product; reactions vary depending on exposure   Lactose Monohydrate [lactose] Other (See Comments)   Patient has alpha gal and is very sensitive to anything that has ingredient that is beef/pork derived product; reactions vary depending on exposure   Latex    Macrobid [nitrofurantoin Macrocrystal] Itching   Severe yeast   Magnesium Nausea And Vomiting   Magnesium Stearate Other (See Comments)   Patient has alpha gal and is very sensitive to anything that has ingredient that is beef -derived product    Other Other (See Comments)   Dissolvable sutures made from animal derived products-  Patient has alpha gal and is very sensitive to anything that has ingredient that is beef/pork derived product; reactions vary depending on exposure   Prednisone    Stearic Acid Other (See Comments)   Patient has alpha gal and is very sensitive to anything that has ingredient that is beef/pork derived product; reactions vary depending on exposure  Medication List    albuterol 108 (90 Base) MCG/ACT inhaler Commonly known as: VENTOLIN HFA 1-2 puffs every 6 (six) hours as needed for shortness of breath or wheezing.   BIOTIN PO Take 1 tablet by mouth daily.   budesonide-formoterol 160-4.5 MCG/ACT inhaler Commonly known as: SYMBICORT 2 puffs daily as needed (shortness of breath).   cetirizine 10 MG tablet Commonly known as: ZYRTEC Take 10 mg by mouth  daily.   doxazosin 4 MG tablet Commonly known as: CARDURA Take 4 mg by mouth daily.   EPINEPHrine 0.3 mg/0.3 mL Soaj injection Commonly known as: EPI-PEN Use as directed for life-threatening allergic reaction.   HYDROcodone-acetaminophen 5-325 MG tablet Commonly known as: NORCO/VICODIN Take 1 tablet by mouth 3 (three) times daily as needed.   Trintellix 10 MG Tabs tablet Generic drug: vortioxetine HBr Take 1 tablet by mouth daily     Past Medical History:  Diagnosis Date   Allergy to alpha-gal    Anxiety and depression    Chronic endometritis    History of COVID-19    History of kidney stones    Migraine headache with aura    Orthostatic hypotension    Pancreatitis, acute    Pneumonia    Syncope    TMJ (temporomandibular joint syndrome)    Urticaria     Past Surgical History:  Procedure Laterality Date   CESAREAN SECTION CLASSICAL     DILATION AND CURETTAGE OF UTERUS     PARTIAL NEPHRECTOMY Left    TUBAL LIGATION     URETERAL STENT PLACEMENT Left    x2   VAGINAL HYSTERECTOMY Bilateral 10/29/2021   Procedure: HYSTERECTOMY VAGINAL WITH BILATERAL SALPINGECTOMY;  Surgeon: Donnamae Jude, MD;  Location: [facility];  Service: Gynecology;  Laterality: Bilateral;    Review of systems negative except as noted in HPI / PMHx or noted below:  Review of Systems  Constitutional: Negative.   HENT: Negative.    Eyes: Negative.   Respiratory: Negative.    Cardiovascular: Negative.   Gastrointestinal: Negative.   Genitourinary: Negative.   Musculoskeletal: Negative.   Skin: Negative.   Neurological: Negative.   Endo/Heme/Allergies: Negative.   Psychiatric/Behavioral: Negative.       Objective:   Vitals:   07/23/22 1625  BP: (!) 84/52  Pulse: 76  Resp: 14  SpO2: 98%          Physical Exam Constitutional:      Appearance: She is not diaphoretic.  HENT:     Head: Normocephalic.     Right Ear: Tympanic membrane, ear canal and external ear normal.     Left Ear:  Tympanic membrane, ear canal and external ear normal.     Nose: Nose normal. No mucosal edema or rhinorrhea.     Mouth/Throat:     Pharynx: Uvula midline. No oropharyngeal exudate.  Eyes:     Conjunctiva/sclera: Conjunctivae normal.  Neck:     Thyroid: No thyromegaly.     Trachea: Trachea normal. No tracheal tenderness or tracheal deviation.  Cardiovascular:     Rate and Rhythm: Normal rate and regular rhythm.     Heart sounds: Normal heart sounds, S1 normal and S2 normal. No murmur heard. Pulmonary:     Effort: No respiratory distress.     Breath sounds: Normal breath sounds. No stridor. No wheezing or rales.  Lymphadenopathy:     Head:     Right side of head: No tonsillar adenopathy.     Left side of head: No tonsillar adenopathy.  Cervical: No cervical adenopathy.  Skin:    Findings: No erythema or rash.     Nails: There is no clubbing.  Neurological:     Mental Status: She is alert.     Diagnostics: none  Assessment and Plan:   1. Perennial allergic rhinitis   2. Allergy with anaphylaxis due to food   3. Allergic reaction to hymenoptera venom   4. Allergic urticaria    1.  Allergen avoidance measures - mammal meat / Dairy consumption / hymenoptera  2.  EpiPen, Benadryl, MD/ER evaluation for allergic reaction  3.  Daily cetirizine 10 mg - 1 tablet 1-2 times per day  4.  Can use a low dose of Rhinocort - 1 spray each nostril 2-3 times per week to prevent upper airway inflammation and recurrent sinus symptoms  5. Return to clinic in 1 year or earlier if needed  6.  Consider immunotherapy for hymenoptera allergy and aeroallergen allergy  Letica appears to be doing relatively well at this point in time while utilizing some cetirizine on a pretty regular basis but she has some significant issues with her nose that have been an active problem throughout the past year and I did recommend that she use a low-dose of Rhinocort.  In the past she has been somewhat  intolerant of nasal steroids because of epistaxis but hopefully we can find a dose that does not precipitate this problem and gives her some benefit.  She has the option of using immunotherapy for both aeroallergen allergy and hymenoptera allergy.  Allena Katz, MD [facility]

## 2022-07-24 ENCOUNTER — Other Ambulatory Visit: Payer: Self-pay

## 2022-07-24 ENCOUNTER — Encounter: Payer: Self-pay | Admitting: Allergy and Immunology

## 2022-07-24 ENCOUNTER — Encounter (HOSPITAL_COMMUNITY): Payer: Self-pay | Admitting: Radiology

## 2022-07-24 ENCOUNTER — Emergency Department (HOSPITAL_COMMUNITY)
Admission: EM | Admit: 2022-07-24 | Discharge: 2022-07-24 | Disposition: A | Discharge status: Home / Self Care | Payer: 59 | Attending: Emergency Medicine | Admitting: Emergency Medicine

## 2022-07-24 ENCOUNTER — Encounter (HOSPITAL_COMMUNITY): Payer: Self-pay | Admitting: Emergency Medicine

## 2022-07-24 ENCOUNTER — Emergency Department (HOSPITAL_COMMUNITY): Payer: 59

## 2022-07-24 DIAGNOSIS — Z91040 Latex allergy status: Secondary | ICD-10-CM | POA: Diagnosis not present

## 2022-07-24 DIAGNOSIS — R1030 Lower abdominal pain, unspecified: Secondary | ICD-10-CM | POA: Insufficient documentation

## 2022-07-24 DIAGNOSIS — R109 Unspecified abdominal pain: Secondary | ICD-10-CM

## 2022-07-24 LAB — BASIC METABOLIC PANEL
Anion gap: 3 — ABNORMAL LOW (ref 5–15)
BUN: 17 mg/dL (ref 6–20)
CO2: 25 mmol/L (ref 22–32)
Calcium: 8.2 mg/dL — ABNORMAL LOW (ref 8.9–10.3)
Chloride: 111 mmol/L (ref 98–111)
Creatinine, Ser: 0.55 mg/dL (ref 0.44–1.00)
GFR, Estimated: >60 mL/min (ref >60)
Glucose, Bld: 91 mg/dL (ref 70–99)
Potassium: 4.2 mmol/L (ref 3.5–5.1)
Sodium: 139 mmol/L (ref 135–145)

## 2022-07-24 LAB — CBC WITH DIFFERENTIAL/PLATELET
Abs Immature Granulocytes: 0.02 10*3/uL (ref 0.00–0.07)
Basophils Absolute: 0.1 10*3/uL (ref 0.0–0.1)
Basophils Relative: 1 %
Eosinophils Absolute: 0.1 10*3/uL (ref 0.0–0.5)
Eosinophils Relative: 2 %
HCT: 37.8 % (ref 36.0–46.0)
Hemoglobin: 12.7 g/dL (ref 12.0–15.0)
Immature Granulocytes: 0 %
Lymphocytes Relative: 35 %
Lymphs Abs: 2 10*3/uL (ref 0.7–4.0)
MCH: 31.8 pg (ref 26.0–34.0)
MCHC: 33.6 g/dL (ref 30.0–36.0)
MCV: 94.5 fL (ref 80.0–100.0)
Monocytes Absolute: 0.4 10*3/uL (ref 0.1–1.0)
Monocytes Relative: 8 %
Neutro Abs: 3.1 10*3/uL (ref 1.7–7.7)
Neutrophils Relative %: 54 %
Platelets: 216 10*3/uL (ref 150–400)
RBC: 4 MIL/uL (ref 3.87–5.11)
RDW: 11.7 % (ref 11.5–15.5)
WBC: 5.7 10*3/uL (ref 4.0–10.5)
nRBC: 0 % (ref 0.0–0.2)

## 2022-07-24 LAB — URINALYSIS, ROUTINE W REFLEX MICROSCOPIC
Bilirubin Urine: NEGATIVE
Glucose, UA: NEGATIVE mg/dL
Hgb urine dipstick: NEGATIVE
Ketones, ur: NEGATIVE mg/dL
Leukocytes,Ua: NEGATIVE
Nitrite: NEGATIVE
Protein, ur: NEGATIVE mg/dL
Specific Gravity, Urine: 1.016 (ref 1.005–1.030)
pH: 5 (ref 5.0–8.0)

## 2022-07-24 MED ORDER — HYDROMORPHONE HCL 1 MG/ML IJ SOLN
1.0000 mg | Freq: Once | INTRAMUSCULAR | Status: AC
  Administered 2022-07-24: 1 mg via INTRAVENOUS
  Filled 2022-07-24: qty 1

## 2022-07-24 MED ORDER — ONDANSETRON HCL 4 MG/2ML IJ SOLN
4.0000 mg | Freq: Once | INTRAMUSCULAR | Status: AC
  Administered 2022-07-24: 4 mg via INTRAVENOUS
  Filled 2022-07-24: qty 2

## 2022-07-24 MED ORDER — SODIUM CHLORIDE 0.9 % IV BOLUS
1000.0000 mL | Freq: Once | INTRAVENOUS | Status: AC
  Administered 2022-07-24: 1000 mL via INTRAVENOUS

## 2022-07-24 MED ORDER — OXYCODONE-ACETAMINOPHEN 5-325 MG PO TABS: 1.0000 | ORAL_TABLET | Freq: Four times a day (QID) | ORAL | 0 refills | Status: DC | PRN

## 2022-07-24 NOTE — ED Provider Notes (Signed)
[facility] DEPT Provider Note   CSN: [id number] Arrival date & time: 07/24/22  5277     History  Chief Complaint  Patient presents with   Flank Pain    [place] is a 46 y.o. female.  Pt is a 46 yo female with a endometriosis s/p hysterectomy, anxiety, pancreatitis, and a complicated GU hx.  She has frequent UTIs and kidney stones.  She has required several stents.  She had a partial left nephrectomy due to renal mass and has duplicated ureters bilaterally.  She has been having flank pain for several days.  She saw urology on 10/24 and was given rx for lortab.  A CT scan is scheduled for tomorrow.  She was here at [facility] working when the pain became unbearable.  She denies fevers.  No dysuria.          Home Medications Prior to Admission medications   Medication Sig Start Date End Date Taking? Authorizing Provider  oxyCODONE-acetaminophen (PERCOCET/ROXICET) 5-325 MG tablet Take 1 tablet by mouth every 6 (six) hours as needed for severe pain. 07/24/22  Yes Isla Pence, MD  albuterol (VENTOLIN HFA) 108 (90 Base) MCG/ACT inhaler 1-2 puffs every 6 (six) hours as needed for shortness of breath or wheezing. 07/21/19   [provider]  BIOTIN PO Take 1 tablet by mouth daily.    [provider]  budesonide-formoterol (SYMBICORT) 160-4.5 MCG/ACT inhaler 2 puffs daily as needed (shortness of breath). 10/24/20   [provider]  cetirizine (ZYRTEC) 10 MG tablet Take 10 mg by mouth daily.    [provider]  doxazosin (CARDURA) 4 MG tablet Take 4 mg by mouth daily. 07/22/22   [provider]  EPINEPHrine 0.3 mg/0.3 mL IJ SOAJ injection Use as directed for life-threatening allergic reaction. 07/23/22   Kozlow, Donnamarie Poag, MD  HYDROcodone-acetaminophen (NORCO/VICODIN) 5-325 MG tablet Take 1 tablet by mouth 3 (three) times daily as needed. 07/22/22   [provider]  vortioxetine HBr (TRINTELLIX) 10 MG TABS  tablet Take 1 tablet by mouth daily Patient not taking: Reported on 07/23/2022 05/22/22         Allergies    Bee venom, Meat [alpha-gal], Influenza vaccines, Bactrim ds [sulfamethoxazole-trimethoprim], Beef-derived products, Buspirone, Codeine, Glycerin, Heparin, Lactated ringers, Lactic acid, Lactose intolerance (gi), Lactose monohydrate [lactose], Latex, Macrobid [nitrofurantoin macrocrystal], Magnesium, Magnesium stearate, Other, Prednisone, and Stearic acid    Review of Systems   Review of Systems  Genitourinary:  Positive for flank pain.  All other systems reviewed and are negative.   Physical Exam Updated Vital Signs BP 104/78   Pulse 75   Temp 97.6 F (36.4 C)   Resp 18   Ht '5\' 7"'$  (1.702 m)   Wt 58.1 kg   LMP 10/09/2021 (Approximate) Comment: tubes tied. hysterctomy scheduled 1.31.23  SpO2 98%   BMI 20.05 kg/m  Physical Exam Vitals and nursing note reviewed.  Constitutional:      Appearance: Normal appearance.  HENT:     Head: Normocephalic and atraumatic.     Right Ear: External ear normal.     Left Ear: External ear normal.     Nose: Nose normal.     Mouth/Throat:     Mouth: Mucous membranes are dry.  Eyes:     Extraocular Movements: Extraocular movements intact.     Conjunctiva/sclera: Conjunctivae normal.     Pupils: Pupils are equal, round, and reactive to light.  Cardiovascular:     Rate and Rhythm: Normal  rate and regular rhythm.     Pulses: Normal pulses.     Heart sounds: Normal heart sounds.  Pulmonary:     Effort: Pulmonary effort is normal.     Breath sounds: Normal breath sounds.  Abdominal:     General: Abdomen is flat. Bowel sounds are normal.     Palpations: Abdomen is soft.  Musculoskeletal:        General: Normal range of motion.     Cervical back: Normal range of motion and neck supple.  Skin:    General: Skin is warm.     Capillary Refill: Capillary refill takes less than 2 seconds.  Neurological:     General: No focal deficit  present.     Mental Status: She is alert and oriented to person, place, and time.  Psychiatric:        Mood and Affect: Mood normal.        Behavior: Behavior normal.     ED Results / Procedures / Treatments   Labs (all labs ordered are listed, but only abnormal results are displayed) Labs Reviewed  BASIC METABOLIC PANEL - Abnormal; Notable for the following components:      Result Value   Calcium 8.2 (*)    Anion gap 3 (*)    All other components within normal limits  URINALYSIS, ROUTINE W REFLEX MICROSCOPIC  CBC WITH DIFFERENTIAL/PLATELET    EKG None  Radiology CT Renal Stone Study  Result Date: 07/24/2022 CLINICAL DATA:  Flank pain, kidney stone suspected EXAM: CT ABDOMEN AND PELVIS WITHOUT CONTRAST TECHNIQUE: Multidetector CT imaging of the abdomen and pelvis was performed following the standard protocol without IV contrast. RADIATION DOSE REDUCTION: This exam was performed according to the departmental dose-optimization program which includes automated exposure control, adjustment of the mA and/or kV according to patient size and/or use of iterative reconstruction technique. COMPARISON:  None Available. FINDINGS: Lower chest: Lung bases are clear. Hepatobiliary: No focal hepatic lesion. No biliary duct dilatation. Common bile duct is normal. Pancreas: Pancreas is normal. No ductal dilatation. No pancreatic inflammation. Spleen: Normal spleen Adrenals/urinary tract: Surgical clips in the LEFT suprarenal region. LEFT adrenal gland not well demonstrated. RIGHT adrenal gland normal. No nephrolithiasis or ureterolithiasis. No obstructive uropathy. No bladder calculi. Stomach/Bowel: Stomach, small bowel, appendix, and cecum are normal. The colon and rectosigmoid colon are normal. Vascular/Lymphatic: Abdominal aorta is normal caliber. No periportal or retroperitoneal adenopathy. No pelvic adenopathy. Reproductive: Unremarkable Other: No free fluid. Musculoskeletal: No aggressive osseous  lesion. IMPRESSION: 1. No nephrolithiasis, ureterolithiasis or obstructive uropathy. 2. No bladder calculi. Electronically Signed   By: Suzy Bouchard M.D.   On: 07/24/2022 09:36    Procedures Procedures    Medications Ordered in ED Medications  sodium chloride 0.9 % bolus 1,000 mL (1,000 mLs Intravenous New Bag/Given 07/24/22 0739)  ondansetron (ZOFRAN) injection 4 mg (4 mg Intravenous Given 07/24/22 0740)  HYDROmorphone (DILAUDID) injection 1 mg (1 mg Intravenous Given 07/24/22 0740)    ED Course/ Medical Decision Making/ A&P                           Medical Decision Making Amount and/or Complexity of Data Reviewed Labs: ordered. Radiology: ordered.  Risk Prescription drug management.   This patient presents to the ED for concern of abd pain, this involves an extensive number of treatment options, and is a complaint that carries with it a high risk of complications and morbidity.  The differential diagnosis  includes kidney stone, pyelo, uti   Co morbidities that complicate the patient evaluation  endometriosis s/p hysterectomy, anxiety, pancreatitis, frequent kidney stones and UTIs   Additional history obtained:  Additional history obtained from epic chart review    Lab Tests:  I Ordered, and personally interpreted labs.  The pertinent results include:  ua nl, cbc nl, bmp nl   Imaging Studies ordered:  I ordered imaging studies including ct renal  I independently visualized and interpreted imaging which showed  IMPRESSION:  1. No nephrolithiasis, ureterolithiasis or obstructive uropathy.  2. No bladder calculi.   I agree with the radiologist interpretation   Cardiac Monitoring:  The patient was maintained on a cardiac monitor.  I personally viewed and interpreted the cardiac monitored which showed an underlying rhythm of: nsr   Medicines ordered and prescription drug management:  I ordered medication including ivfs, dilaudid, and zofran  for pain and  nausea  Reevaluation of the patient after these medicines showed that the patient improved I have reviewed the patients home medicines and have made adjustments as needed   Test Considered:  ct   Critical Interventions:  Pain control   Problem List / ED Course:  Flank pain:  etiology unclear.  It is possible she passed a small stone.  No evidence of UTI or stone now.  Pt is feeling better.  She is stable for d/c.  Return if worse.  F/u with urology.   Reevaluation:  After the interventions noted above, I reevaluated the patient and found that they have :improved   Social Determinants of Health:  Lives at home   Dispostion:  After consideration of the diagnostic results and the patients response to treatment, I feel that the patent would benefit from discharge with outpatient f/u.          Final Clinical Impression(s) / ED Diagnoses Final diagnoses:  Flank pain    Rx / DC Orders ED Discharge Orders          Ordered    oxyCODONE-acetaminophen (PERCOCET/ROXICET) 5-325 MG tablet  Every 6 hours PRN        07/24/22 0948              Isla Pence, MD 07/24/22 [phone]

## 2022-07-24 NOTE — ED Triage Notes (Signed)
Pt c/o left sided flank pain. Pt states that she has kidney stones and is scheduled for a  CT tomorrow. States pain is unbearable.

## 2022-08-01 ENCOUNTER — Encounter: Payer: Self-pay | Admitting: Family Medicine

## 2022-08-19 ENCOUNTER — Other Ambulatory Visit (HOSPITAL_COMMUNITY): Payer: Self-pay

## 2022-08-22 ENCOUNTER — Other Ambulatory Visit (HOSPITAL_COMMUNITY): Payer: Self-pay

## 2022-08-22 ENCOUNTER — Encounter (HOSPITAL_COMMUNITY): Payer: Self-pay

## 2022-08-27 DIAGNOSIS — J020 Streptococcal pharyngitis: Secondary | ICD-10-CM | POA: Diagnosis not present

## 2022-08-27 DIAGNOSIS — B349 Viral infection, unspecified: Secondary | ICD-10-CM | POA: Diagnosis not present

## 2022-08-27 DIAGNOSIS — Z6820 Body mass index (BMI) 20.0-20.9, adult: Secondary | ICD-10-CM | POA: Diagnosis not present

## 2022-11-12 IMAGING — MG DIGITAL DIAGNOSTIC BILAT W/ TOMO W/ CAD
6 of 10 series · 6 of 30 positions shown · non-contrast
Comparison: Prior films

CLINICAL DATA: Callback from screening mammogram for possible
masses in both breasts.



[L CC synth-2D (1 of 2)]
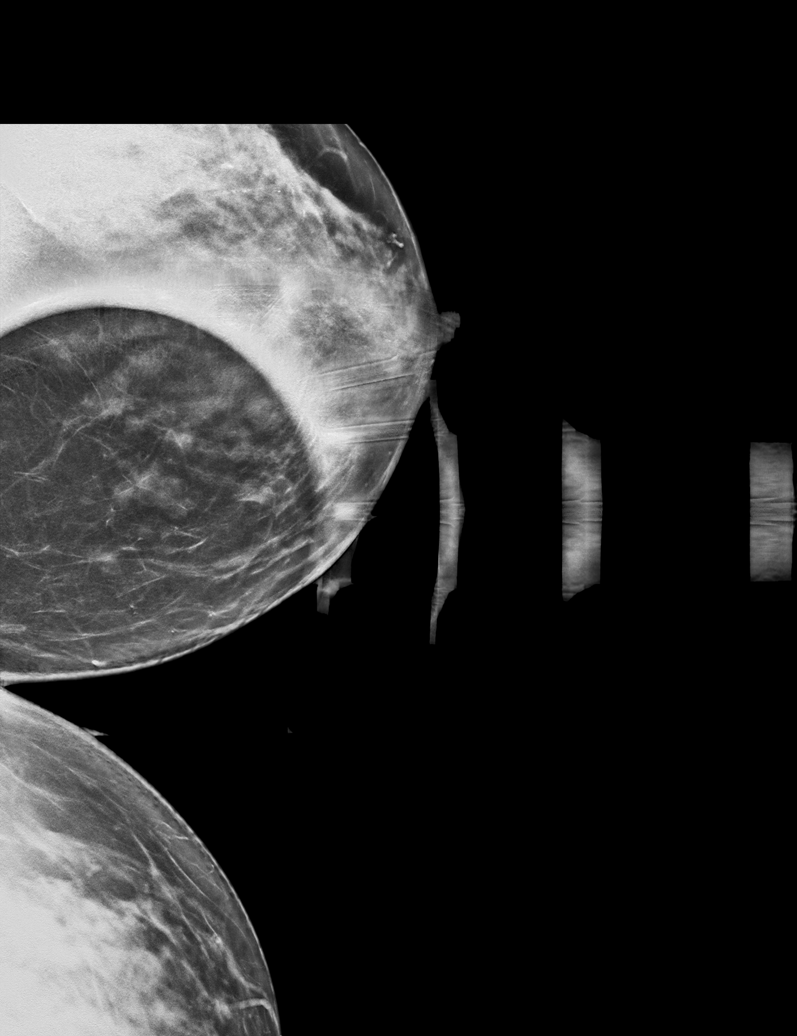

[L CC synth-2D (2 of 2)]
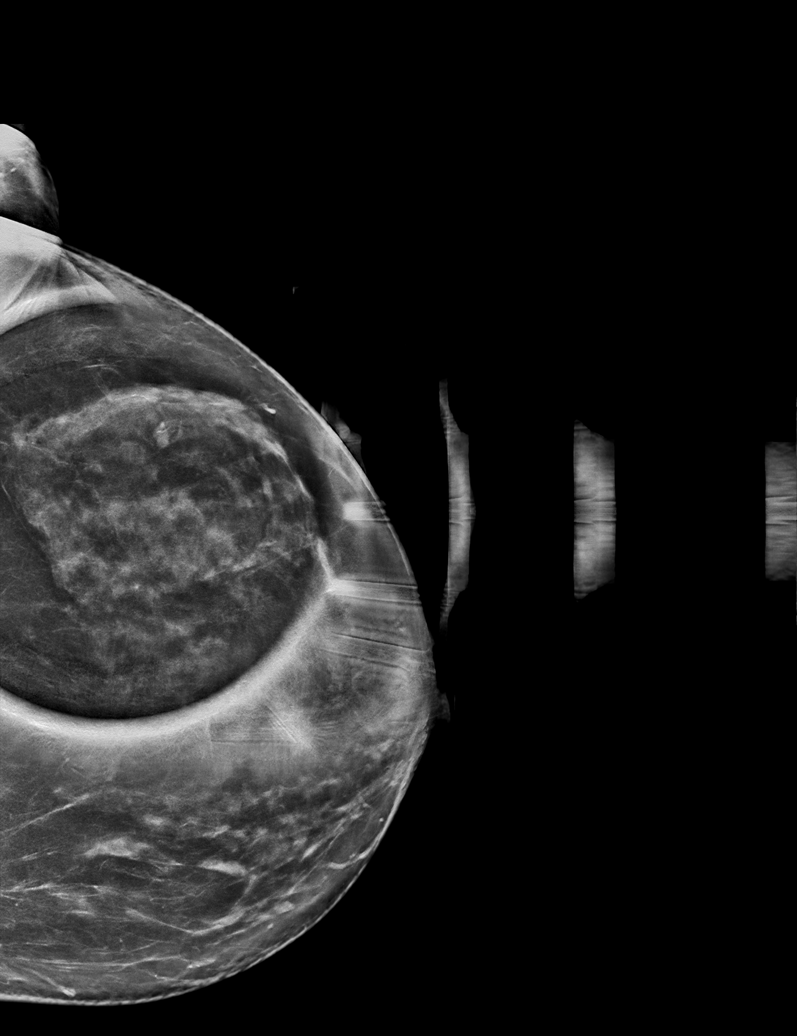

[R MLO synth-2D]
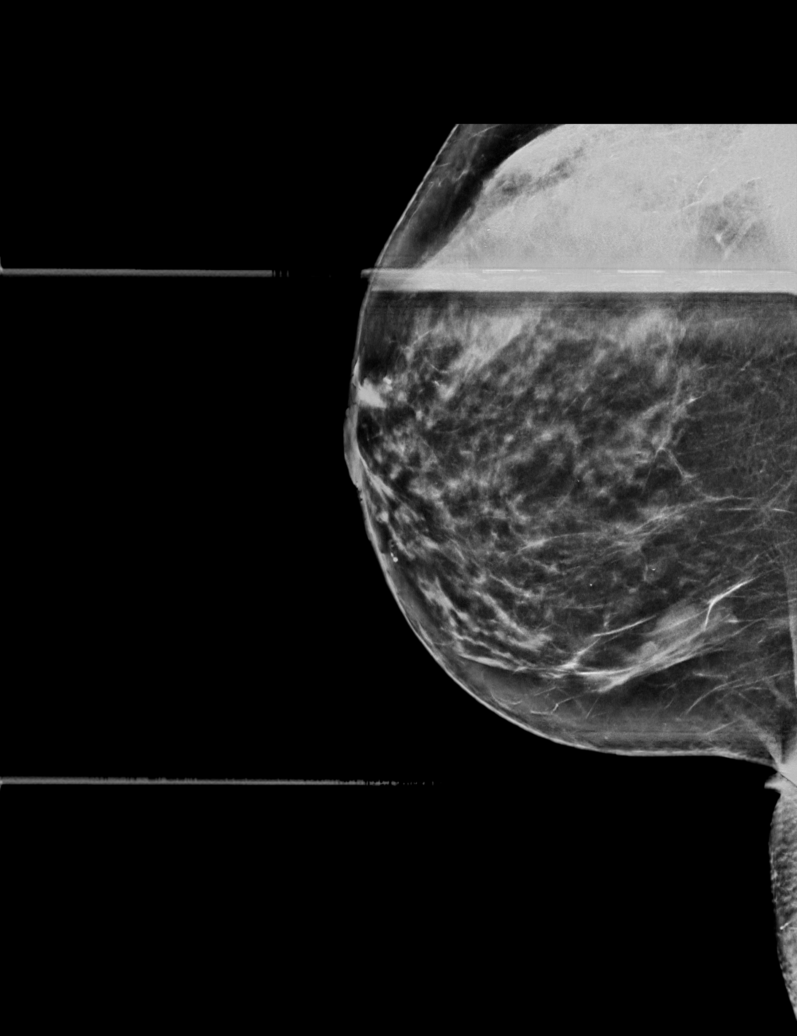

[L MLO synth-2D]
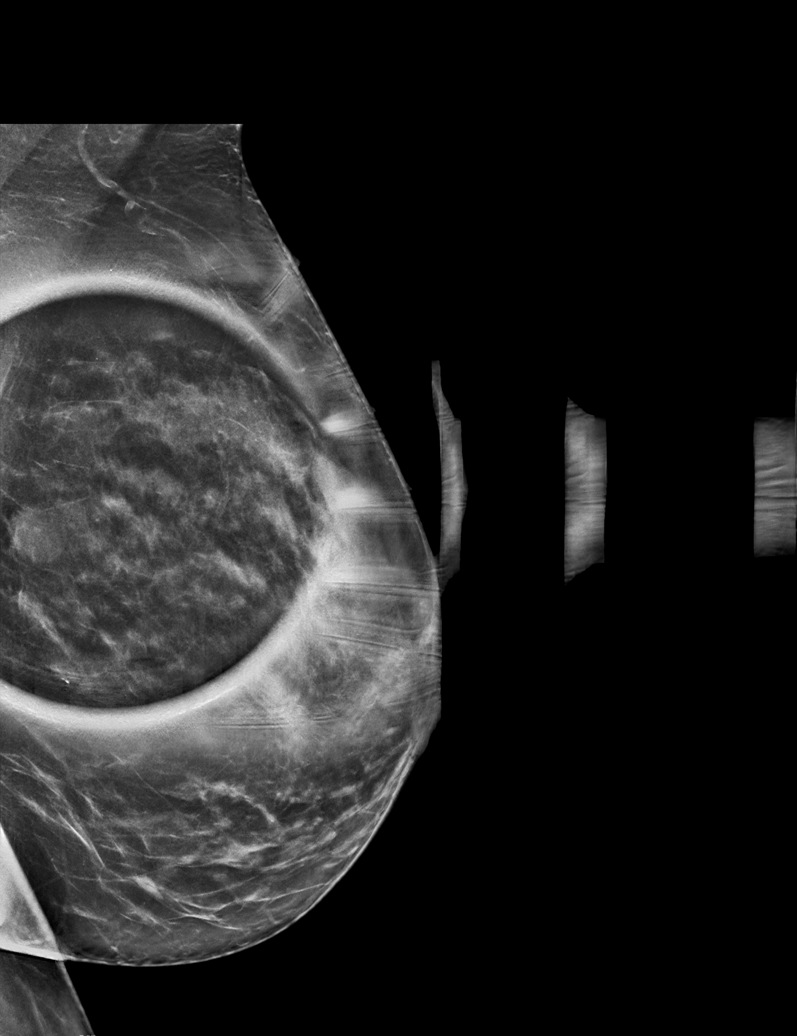

[R CC synth-2D]
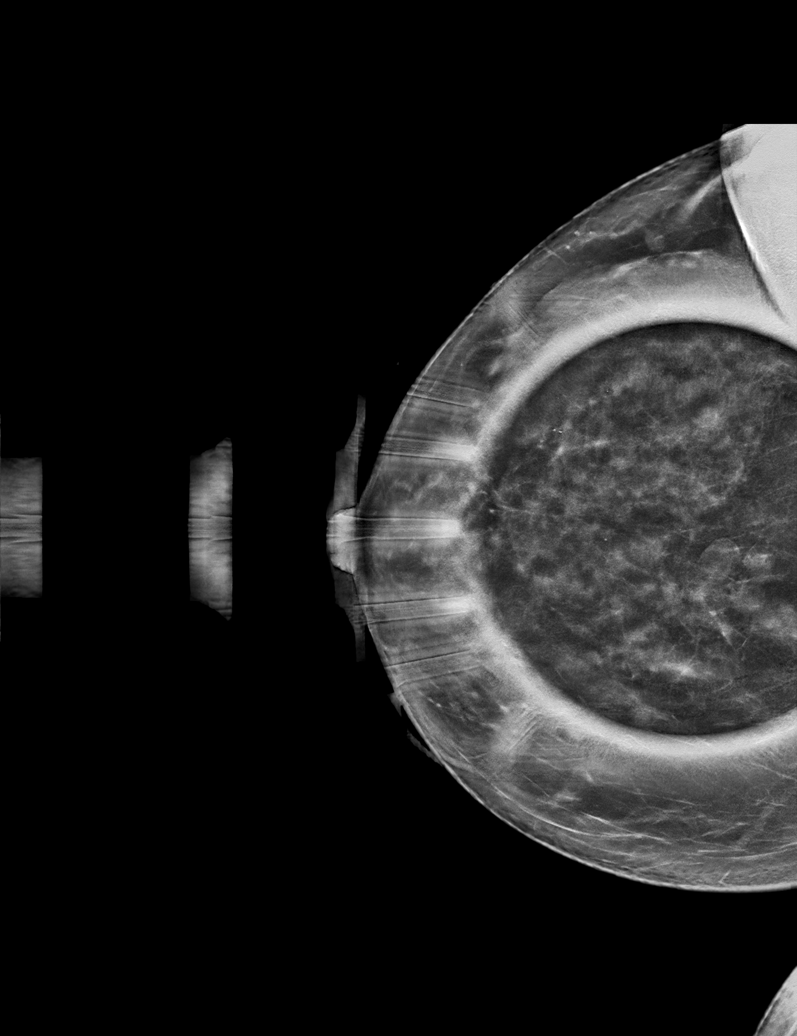

[R MLO tomo · tomo slice 38/75.0]
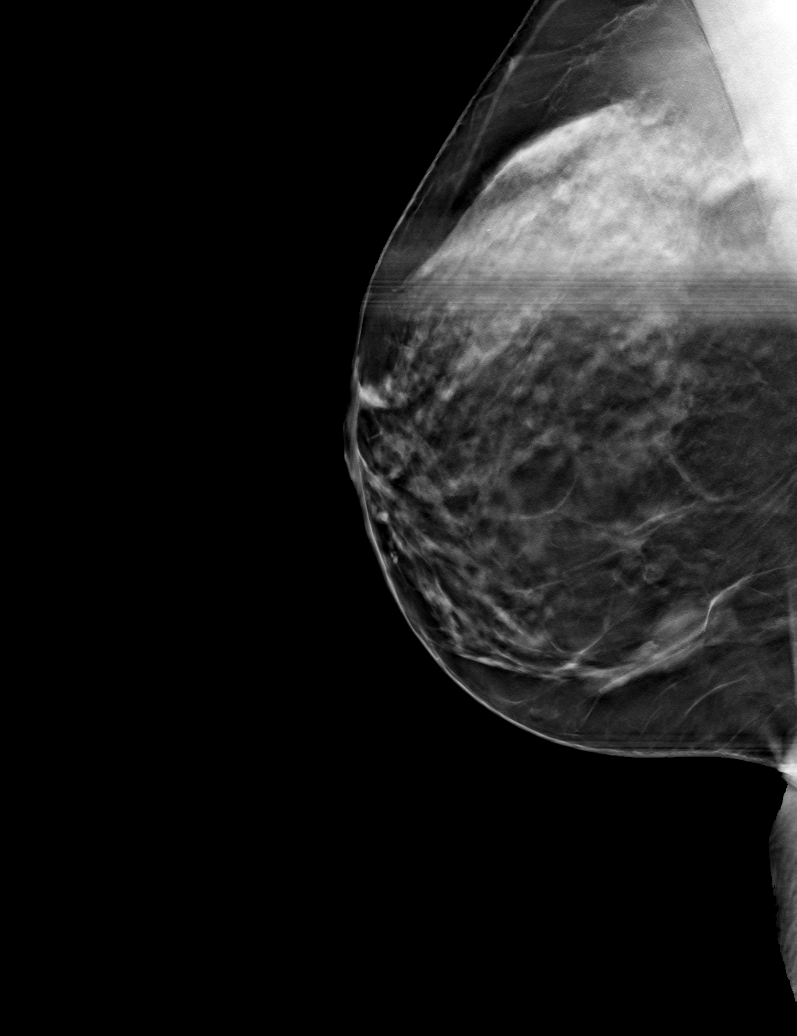

[6 of 30 positions shown; findings below may reference images not displayed]

ACR Breast Density Category c: The breast tissue is heterogeneously
dense, which may obscure small masses.
FINDINGS: Spot compression cc and MLO views of bilateral breasts are
submitted. The previously noted group of masses in the right breast
6 o'clock is persistent. The previously noted mass in the lateral
posterior left breast is persistent. The previously noted mass in
the medial posterior left breast is persistent.

Targeted ultrasound is performed, showing a cluster of cysts at the
right breast 6 o'clock 4 cm from nipple measuring 2.3 x 0.7 x 1.1 cm
in combined dimension correlating to the mammographic mass.

Ultrasound of the left breast demonstrates a 7 x 5 x 6 mm simple
cyst at the left breast 2 o'clock 4 cm from nipple correlating to
the lateral mass noted on mammogram. At the left breast 8:30 o'clock
3 cm from nipple, there is a group of simple cyst measuring 7 mm
combined dimension. At the left breast 10 o'clock 4 cm from nipple,
8 x 5 x 10 mm in dimension. One of these groups of cysts correlate
to the medial mass seen on mammogram.
IMPRESSION: Benign findings.

RECOMMENDATION:
Routine screening mammogram back on schedule.

I have discussed the findings and recommendations with the patient.
If applicable, a reminder letter will be sent to the patient
regarding the next appointment.

BI-RADS CATEGORY  2: Benign.

## 2022-12-24 ENCOUNTER — Telehealth: Payer: Self-pay | Admitting: Allergy and Immunology

## 2022-12-24 NOTE — Telephone Encounter (Signed)
Patient is requesting a letter for her employer stating that she cannot get the COVID/Flu vaccines due to her alpha gal. She states we can send her a copy through my chart.

## 2022-12-25 ENCOUNTER — Encounter: Payer: Self-pay | Admitting: *Deleted

## 2022-12-25 NOTE — Telephone Encounter (Signed)
Mychart message sent with Dr. Bruna Potter response.

## 2023-10-22 DIAGNOSIS — Z2089 Contact with and (suspected) exposure to other communicable diseases: Secondary | ICD-10-CM | POA: Diagnosis not present

## 2023-10-22 DIAGNOSIS — R6889 Other general symptoms and signs: Secondary | ICD-10-CM | POA: Diagnosis not present

## 2023-10-22 DIAGNOSIS — J069 Acute upper respiratory infection, unspecified: Secondary | ICD-10-CM | POA: Diagnosis not present

## 2023-11-26 DIAGNOSIS — M25511 Pain in right shoulder: Secondary | ICD-10-CM | POA: Diagnosis not present

## 2023-11-26 DIAGNOSIS — M79601 Pain in right arm: Secondary | ICD-10-CM | POA: Diagnosis not present

## 2023-11-26 DIAGNOSIS — M5416 Radiculopathy, lumbar region: Secondary | ICD-10-CM | POA: Diagnosis not present

## 2023-11-26 DIAGNOSIS — M5411 Radiculopathy, occipito-atlanto-axial region: Secondary | ICD-10-CM | POA: Diagnosis not present

## 2024-01-06 DIAGNOSIS — J3089 Other allergic rhinitis: Secondary | ICD-10-CM | POA: Diagnosis not present

## 2024-02-18 DIAGNOSIS — J3089 Other allergic rhinitis: Secondary | ICD-10-CM | POA: Diagnosis not present

## 2024-02-18 DIAGNOSIS — H6993 Unspecified Eustachian tube disorder, bilateral: Secondary | ICD-10-CM | POA: Diagnosis not present

## 2024-02-24 DIAGNOSIS — R519 Headache, unspecified: Secondary | ICD-10-CM | POA: Diagnosis not present

## 2024-02-24 DIAGNOSIS — B0089 Other herpesviral infection: Secondary | ICD-10-CM | POA: Diagnosis not present

## 2024-10-06 ENCOUNTER — Ambulatory Visit: Payer: Self-pay | Attending: Cardiology | Admitting: Cardiology

## 2024-10-06 ENCOUNTER — Encounter: Payer: Self-pay | Admitting: Cardiology

## 2024-10-06 VITALS — BP 102/76 | HR 103 | Ht 67.0 in | Wt 126.0 lb

## 2024-10-06 DIAGNOSIS — R002 Palpitations: Secondary | ICD-10-CM | POA: Insufficient documentation

## 2024-10-06 DIAGNOSIS — R55 Syncope and collapse: Secondary | ICD-10-CM | POA: Insufficient documentation

## 2024-10-06 DIAGNOSIS — I7389 Other specified peripheral vascular diseases: Secondary | ICD-10-CM | POA: Insufficient documentation

## 2024-10-06 DIAGNOSIS — F410 Panic disorder [episodic paroxysmal anxiety] without agoraphobia: Secondary | ICD-10-CM | POA: Insufficient documentation

## 2024-10-06 MED ORDER — PROPRANOLOL HCL 10 MG PO TABS: 10.0000 mg | ORAL_TABLET | Freq: Three times a day (TID) | ORAL | 2 refills | Status: DC

## 2024-10-06 NOTE — Progress Notes (Signed)
 " Cardiology Office Note:  .   Date:  10/07/2024  ID:  [name], DOB 02-22-1976, MRN [id number] PCP: Orlando Dwayne NOVAK, FNP  [facility] Providers Cardiologist:  Gordy Bergamo, MD   History of Present Illness: .   [name] is a 49 y.o. Caucasian female with longstanding history of mild orthostatic hypotension and chronic palpitations, symptoms controlled with good hydration and excess salt intake, chronic migraine with aura.   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  She comes in for increased anxiety, panic attacks and elevated heart rate.  She also endorses episodes of syncope and near syncope that is chronic.    Discussed the use of AI scribe software for clinical note transcription with the patient, who gave verbal consent to proceed.  History of Present Illness [name] is a 49 year old female with a history of panic attacks and syncopal episodes who presents with concerns about her heart rate and recent syncope.  She has episodic increased heart rate associated with anxiety and panic attacks, with associated irritability and anxiety. She started Virgil a few days ago for these symptoms.  She has recurrent syncopal episodes that usually have warning signs, which let her take preventive measures. In October she had one sudden syncopal event without warning that caused a fall and large bruise. She has had no further unwarned episodes since.  She is scheduled for wrist and elbow surgery for compressed nerves and carpal tunnel, which does not affect today's cardiac evaluation.  She notes her hands and feet turn bluish when sitting or standing for prolonged periods, including in the  shower.  She takes Maxalt for migraines. She notes migraines when her systolic blood pressure is above about 110 mmHg and is concerned about her blood pressure in relation to her symptoms.  Cardiac Studies relevent.    Ambulatory cardiac telemetry 10/29/2020-11/11/2020: Predominant underlying rhythm was sinus with minimum heart rate 39 bpm, maximum heart rate 139 bpm, average heart rate 79 bpm.  Symptoms correlated with sinus rhythm and PVC.  Rare PACs and PVCs.  2 episodes of supraventricular tachycardia longest lasting 4.5 seconds with maximum heart rate of 179 bpm, EKG reveals atrial tachycardia and some with AT with abberancy.  No evidence of ventricular tachycardia, atrial fibrillation, high degree AV block, pauses >3 seconds.     Echocardiogram 10/29/2020: Left ventricle cavity is normal in size and wall thickness. Normal global wall motion. Normal LV systolic function with visual EF 55-60%. Normal diastolic filling pattern.  No significant valvular abnormalities. No evidence of pulmonary hypertension.     Exercise treadmill stress test 10/29/2020: Exercise treadmill stress test performed using Bruce protocol.  Patient reached 11.1 METS, and 98% of age predicted maximum heart rate.  Exercise capacity was excellent.  Non-limiting chest pain reported.  Normal heart rate and hemodynamic response. Stress EKG revealed no ischemic changes. Low risk study.  EKG:   EKG Interpretation Date/Time:  Thursday October 06 2024 09:58:22 EST Ventricular Rate:  103 PR Interval:  136 QRS Duration:  78 QT Interval:  348 QTC Calculation: 455 R Axis:   72  Text Interpretation: EKG 10/06/2024: Sinus tachycardia at rate of 103 bpm, IRBBB.  No significant change from  09/27/2020. Confirmed by Dashauna Heymann, Jagadeesh [phone]) on 10/06/2024 10:02:26 AM  Labs   Lab Results  Component Value Date   CHOL 177 04/30/2020   HDL 51 04/30/2020   LDLCALC 112 (H) 04/30/2020   TRIG 75 04/30/2020   No results found for:  LIPOA  No results for input(s): NA, K, CL, CO2, GLUCOSE, BUN, CREATININE, CALCIUM, GFRNONAA, GFRAA in the last 8760 hours.  Lab Results  Component Value Date   ALT 12 06/27/2021   AST 20 06/27/2021   ALKPHOS 43 06/27/2021   BILITOT 1.9 (H) 06/27/2021      Latest Ref Rng & Units 07/24/2022    7:40 AM 10/30/2021    4:23 AM 10/25/2021    2:49 PM  CBC  WBC 4.0 - 10.5 K/uL 5.7  9.6  7.7   Hemoglobin 12.0 - 15.0 g/dL 87.2  88.6  85.8   Hematocrit 36.0 - 46.0 % 37.8  33.2  41.2   Platelets 150 - 400 K/uL 216  224  261    Lab Results  Component Value Date   HGBA1C 5.1 04/17/2020    Lab Results  Component Value Date   TSH 1.650 09/13/2021    ROS  Review of Systems  Cardiovascular:  Positive for palpitations and syncope. Negative for chest pain, dyspnea on exertion and leg swelling.  Psychiatric/Behavioral:  Positive for hypervigilance. The patient is nervous/anxious.    Physical Exam:   VS:  BP 102/76   Pulse (!) 103   Ht 5' 7 (1.702 m)   Wt 126 lb (57.2 kg)   LMP 10/09/2021 Comment: tubes tied. hysterctomy scheduled 1.31.23  SpO2 95%   BMI 19.73 kg/m    Wt Readings from Last 3 Encounters:  10/06/24 126 lb (57.2 kg)  07/24/22 128 lb (58.1 kg)  06/24/22 128 lb 12.8 oz (58.4 kg)    BP Readings from Last 3 Encounters:  10/06/24 102/76  07/24/22 104/78  07/23/22 (!) 84/52   Physical Exam Neck:     Vascular: No carotid bruit or JVD.  Cardiovascular:     Rate and Rhythm: Normal rate and regular rhythm.     Pulses: Intact distal pulses.     Heart sounds: Normal heart sounds. No murmur heard.    No gallop.  Pulmonary:     Effort: Pulmonary effort is normal.     Breath sounds: Normal breath sounds.  Abdominal:     General: Bowel sounds are normal.     Palpations: Abdomen is soft.  Musculoskeletal:     Right lower leg: No edema.     Left lower leg: No edema.     ASSESSMENT AND PLAN: .      ICD-10-CM   1. Neurogenic syncope  R55 DISCONTINUED:  propranolol  (INDERAL ) 10 MG tablet    2. Palpitations  R00.2 EKG 12-Lead    atenolol  (TENORMIN ) 25 MG tablet    DISCONTINUED: propranolol  (INDERAL ) 10 MG tablet    3. Panic attacks  F41.0 atenolol  (TENORMIN ) 25 MG tablet    DISCONTINUED: propranolol  (INDERAL ) 10 MG tablet    4. Acrocyanosis  I73.89      Assessment & Plan Neurocardiogenic syncope with palpitations and panic disorder Experiences neurocardiogenic syncope with palpitations and panic disorder, with recent syncopal episodes, including one in October without forewarning. Reports increased anxiety and palpitations, exacerbated by recent family stressors. Propranolol  is considered to address syncope, anxiety, palpitations, and migraines. - Prescribed atenolol  25 mg a day, with the option to increase to 2 times a day  if needed. - Sent prescription to [employer]. - Advised to monitor blood pressure to avoid hypertension.  Acrocyanosis Reports acrocyanosis with bluish discoloration of hands and feet, particularly when sitting or standing for extended periods. Condition is benign and not indicative of circulation problems. - Reassured that acrocyanosis is benign and not a cause for concern.   Follow up: As needed  Signed,  Gordy Bergamo, MD, [facility] 10/07/2024, 10:15 AM [facility] [address] [place], [place] [address] Phone: [phone]. Fax:  [phone]  "

## 2024-10-06 NOTE — Patient Instructions (Signed)
 Medication Instructions:  START  propranolol  (INDERAL ) 10 MG tablet         Take 1 tablet (10 mg total) by mouth 3 (three) times daily   *If you need a refill on your cardiac medications before your next appointment, please call your pharmacy*   Follow-Up: At [facility], you and your health needs are our priority.  As part of our continuing mission to provide you with exceptional heart care, our providers are all part of one team.  This team includes your primary Cardiologist (physician) and Advanced Practice Providers or APPs (Physician Assistants and Nurse Practitioners) who all work together to provide you with the care you need, when you need it.  Your next appointment:    AS NEEDED  Provider:   Gordy Bergamo, MD       We recommend signing up for the patient portal called MyChart.  Patients are able to view lab/test results, encounter notes, upcoming appointments, etc.  Non-urgent messages can be sent to your provider as well, go to [email or web address].

## 2024-10-07 MED ORDER — ATENOLOL 25 MG PO TABS: 25.0000 mg | ORAL_TABLET | Freq: Every day | ORAL | 1 refills | Status: AC

## 2024-10-26 ENCOUNTER — Ambulatory Visit: Admitting: Allergy and Immunology

## 2024-10-26 ENCOUNTER — Encounter: Payer: Self-pay | Admitting: Allergy and Immunology

## 2024-10-26 VITALS — BP 108/64 | HR 82 | Resp 16 | Ht 67.0 in | Wt 128.2 lb

## 2024-10-26 DIAGNOSIS — T7800XA Anaphylactic reaction due to unspecified food, initial encounter: Secondary | ICD-10-CM

## 2024-10-26 DIAGNOSIS — T7800XD Anaphylactic reaction due to unspecified food, subsequent encounter: Secondary | ICD-10-CM

## 2024-10-26 DIAGNOSIS — Z91038 Other insect allergy status: Secondary | ICD-10-CM

## 2024-10-26 DIAGNOSIS — J4520 Mild intermittent asthma, uncomplicated: Secondary | ICD-10-CM

## 2024-10-26 DIAGNOSIS — M2559 Pain in other specified joint: Secondary | ICD-10-CM | POA: Diagnosis not present

## 2024-10-26 DIAGNOSIS — Z91014 Allergy to mammalian meats: Secondary | ICD-10-CM

## 2024-10-26 MED ORDER — EPINEPHRINE 0.3 MG/0.3ML IJ SOAJ: INTRAMUSCULAR | 3 refills | Status: AC

## 2024-10-26 MED ORDER — CETIRIZINE HCL 10 MG PO TABS: ORAL_TABLET | ORAL | 5 refills | Status: AC

## 2024-10-26 MED ORDER — FLUTICASONE PROPIONATE HFA 44 MCG/ACT IN AERO: INHALATION_SPRAY | RESPIRATORY_TRACT | 1 refills | Status: AC

## 2024-10-26 MED ORDER — VENTOLIN HFA 108 (90 BASE) MCG/ACT IN AERS: INHALATION_SPRAY | RESPIRATORY_TRACT | 1 refills | Status: AC

## 2024-10-26 NOTE — Progress Notes (Unsigned)
 "  [facility] [place] [facility] [place] [facility] [place] -    Follow-up Note  Referring Provider: Orlando Dwayne NOVAK, FNP Primary Provider: Orlando Dwayne NOVAK, FNP Date of Office Visit: 10/26/2024  Subjective:   [name] (DOB: 1976/07/01) is a 49 y.o. female who [facility] in re-evaluation of the following:  HPI: [name] returns to this clinic in evaluation of alpha gal syndrome, hymenoptera venom allergy, history of urticaria, history of allergic rhinitis.  I last saw her in this clinic 23 July 2022.  She continues to have problems with alpha gal exposure.  If she smells cooking pork or cooking beef, but predominantly pork exposure, she develops GI upset, vomiting, and hives.  It appears that lactose containing meals also gives rise to GI upset but no hives.  She is having these reactions about twice a month.  This occurs even though she uses her cetirizine  twice a day.  While utilizing her cetirizine  twice a day she does have some breakthrough hives.  About twice a month she must add in a Benadryl  which works great to take care of of those breakthrough hives.  She has been having some issues with nasal congestion and some sneezing.  This occurs even though she uses cetirizine  twice a day and occasionally uses a saline wash for her upper airway.  This increased symptomatology appears to correlate with introduction of a dog inside the household.  She was stung by yellow jacket on her arm in 2025 and had breathing and skin issues and used an EpiPen  successfully.  She apparently has an albuterol  inhaler which she uses during the spring and fall once or twice a week.  She has also been having problems with diffuse joint aches.  She states her hands and elbows and knees are involved with a progressive issue with joint aching and morning stiffness.  Allergies as of 10/26/2024       Reactions   Bee Venom Itching, Shortness Of Breath,  Swelling   Meat [alpha-gal] Anaphylaxis   Influenza Vaccines Hives, Rash   Bactrim  Ds [sulfamethoxazole -trimethoprim ] Nausea And Vomiting   Bovine (beef) Protein-containing Drug Products    Severe stomach cramping, diarrhea   Buspirone  Diarrhea, Other (See Comments)   Codeine Nausea And Vomiting   Pt states it is the mix of the codeine with tylenol -3 that causes the nausea and vomiting    Glycerin Other (See Comments)   Patient has alpha gal and is very sensitive to anything that has ingredient that is beef/pork derived product; reactions vary depending on exposure   Heparin Other (See Comments)   Patient has alpha gal and is very sensitive to anything that has ingredient that is beef/pork derived product; reactions vary depending on exposure   Lactated Ringers  Other (See Comments)   Patient has alpha gal and is very sensitive to anything that has ingredient that is beef/pork derived product; reactions vary depending on exposure   Lactic Acid Other (See Comments)   Patient has alpha gal and is very sensitive to anything that has ingredient that is beef/pork derived product; reactions vary depending on exposure   Lactose Intolerance (gi) Other (See Comments)   Patient has alpha gal and is very sensitive to anything that has ingredient that is beef/pork derived product; reactions vary depending on exposure   Lactose Monohydrate [lactose] Other (See Comments)   Patient has alpha gal and is very sensitive to anything that has ingredient that is beef/pork derived product;  reactions vary depending on exposure   Latex    Macrobid [nitrofurantoin Macrocrystal] Itching   Severe yeast   Magnesium Nausea And Vomiting   Magnesium Stearate Other (See Comments)   Patient has alpha gal and is very sensitive to anything that has ingredient that is beef -derived product    Other Other (See Comments)   Dissolvable sutures made from animal derived products-  Patient has alpha gal and is very sensitive to  anything that has ingredient that is beef/pork derived product; reactions vary depending on exposure   Prednisone    Stearic Acid Other (See Comments)   Patient has alpha gal and is very sensitive to anything that has ingredient that is beef/pork derived product; reactions vary depending on exposure        Medication List    atenolol  25 MG tablet Commonly known as: TENORMIN  Take 1 tablet (25 mg total) by mouth daily. Take additional tab as needed daily for anxiety and palpitations   cetirizine  10 MG tablet Commonly known as: ZYRTEC  Take 1-2 tablets once or twice daily as directed.   EPINEPHrine  0.3 mg/0.3 mL Soaj injection Commonly known as: EPI-PEN Use as directed for life-threatening allergic reaction.     Past Medical History:  Diagnosis Date   Allergy to alpha-gal    Anxiety and depression    Chronic endometritis    History of COVID-19    History of kidney stones    Migraine headache with aura    Orthostatic hypotension    Pancreatitis, acute    Pneumonia    Syncope    TMJ (temporomandibular joint syndrome)    Urticaria     Past Surgical History:  Procedure Laterality Date   CESAREAN SECTION CLASSICAL     DILATION AND CURETTAGE OF UTERUS     PARTIAL NEPHRECTOMY Left    TUBAL LIGATION     URETERAL STENT PLACEMENT Left    x2   VAGINAL HYSTERECTOMY Bilateral 10/29/2021   Procedure: HYSTERECTOMY VAGINAL WITH BILATERAL SALPINGECTOMY;  Surgeon: Fredirick Glenys RAMAN, MD;  Location: [facility] OR;  Service: Gynecology;  Laterality: Bilateral;    Review of systems negative except as noted in HPI / PMHx or noted below:  Review of Systems  Constitutional: Negative.   HENT: Negative.    Eyes: Negative.   Respiratory: Negative.    Cardiovascular: Negative.   Gastrointestinal: Negative.   Genitourinary: Negative.   Musculoskeletal: Negative.   Skin: Negative.   Neurological: Negative.   Endo/Heme/Allergies: Negative.   Psychiatric/Behavioral: Negative.       Objective:    Vitals:   10/26/24 1037  BP: 108/64  Pulse: 82  Resp: 16  SpO2: 99%   Height: 5' 7 (170.2 cm)  Weight: 128 lb 3.2 oz (58.2 kg)   Physical Exam Constitutional:      Appearance: She is not diaphoretic.  HENT:     Head: Normocephalic.     Right Ear: Tympanic membrane, ear canal and external ear normal.     Left Ear: Tympanic membrane, ear canal and external ear normal.     Nose: Nose normal. No mucosal edema or rhinorrhea.     Mouth/Throat:     Pharynx: Uvula midline. No oropharyngeal exudate.  Eyes:     Conjunctiva/sclera: Conjunctivae normal.  Neck:     Thyroid : No thyromegaly.     Trachea: Trachea normal. No tracheal tenderness or tracheal deviation.  Cardiovascular:     Rate and Rhythm: Normal rate and regular rhythm.     Heart  sounds: Normal heart sounds, S1 normal and S2 normal. No murmur heard. Pulmonary:     Effort: No respiratory distress.     Breath sounds: Normal breath sounds. No stridor. No wheezing or rales.  Lymphadenopathy:     Head:     Right side of head: No tonsillar adenopathy.     Left side of head: No tonsillar adenopathy.     Cervical: No cervical adenopathy.  Skin:    Findings: No erythema or rash.     Nails: There is no clubbing.  Neurological:     Mental Status: She is alert.     Diagnostics: none  Assessment and Plan:   1. Alpha-gal syndrome   2. Allergy with anaphylaxis due to food   3. Pain in other joint   4. History of systemic reaction to hymenoptera sting   5. Asthma, mild intermittent, well-controlled    1.  Allergen avoidance measures - mammal meat / Dairy consumption / hymenoptera  2.  Cetirizine  10 mg - 1-2 tablet 1-2 times per day (MAX=4/day)  3.  OTC Rhinocort / Budesonide- 1 spray each nostril 1-7 times per week  4. If needed:   A. EpiPen , Benadryl , MD/ER evaluation for allergic reaction  B. Albuterol  + Flovent  44 - 2 inhalations TOGETHER every 4-6 hours  5. Blood - Total IgE, alpha-gal panel, Milk IgE  components, gelatin IgE, ANA w/r, CCP, RF, SED, CRP  6.  Consider starting Omalizumab / Xolair for food allergy  7. Influenza = Tamiflu. Covid = Paxlovid  8. Return to clinic in 1 year or earlier if problem  If Joline is having so much problem with alpha gal exposure then maybe she should consider starting a course of omalizumab to raise her exposure threshold concerning this food product and as well this would also help with her springtime respiratory issues and her hymenoptera venom hypersensitivity.  We have given her literature on this form of therapy during today's visit.  She would do better using some nasal steroids and we once again made that suggestion.  We will analyze whether or not she is a candidate for omalizumab with the blood test noted above.  As well, she has diffuse arthralgia and we will obtain a rheumatologic panel as noted above.  Camellia Denis, MD Allergy / Immunology  [facility] "

## 2024-10-26 NOTE — Patient Instructions (Addendum)
" °  1.  Allergen avoidance measures - mammal meat / Dairy consumption / hymenoptera  2.  Cetirizine  10 mg - 1-2 tablet 1-2 times per day (MAX=4/day)  3.  OTC Rhinocort / Budesonide- 1 spray each nostril 1-7 times per week  4. If needed:   A. EpiPen , Benadryl , MD/ER evaluation for allergic reaction  B. Albuterol  + Flovent  44 - 2 inhalations TOGETHER every 4-6 hours  5. Blood - Total IgE, alpha-gal panel, Milk IgE components, gelatin IgE, ANA w/r, CCP, RF, SED, CRP  6.  Consider starting Omalizumab / Xolair for food allergy  7. Influenza = Tamiflu. Covid = Paxlovid  8. Return to clinic in 1 year or earlier if problem "

## 2024-10-27 ENCOUNTER — Encounter: Payer: Self-pay | Admitting: Allergy and Immunology

## 2024-10-29 LAB — ALPHA-GAL PANEL
Allergen Lamb IgE: 0.23 kU/L — AB
Beef IgE: 0.38 kU/L — AB
IgE (Immunoglobulin E), Serum: 183 [IU]/mL (ref 6–495)
O215-IgE Alpha-Gal: 0.58 kU/L — AB
Pork IgE: 0.15 kU/L — AB

## 2024-10-29 LAB — ANTINUCLEAR ANTIBODIES, IFA: ANA Titer 1: POSITIVE — AB

## 2024-10-29 LAB — RHEUMATOID FACTOR: Rheumatoid fact SerPl-aCnc: <10 [IU]/mL

## 2024-10-29 LAB — C074-IGE GELATIN: C074-IgE Gelatin: <0.1 kU/L

## 2024-10-29 LAB — MILK COMPONENT PANEL
F076-IgE Alpha Lactalbumin: <0.1 kU/L
F077-IgE Beta Lactoglobulin: <0.1 kU/L
F078-IgE Casein: <0.1 kU/L

## 2024-10-29 LAB — CYCLIC CITRUL PEPTIDE ANTIBODY, IGG/IGA: Cyclic Citrullin Peptide Ab: 3 U (ref 0–19)

## 2024-10-29 LAB — C-REACTIVE PROTEIN: CRP: <1 mg/L (ref 0–10)

## 2024-10-29 LAB — FANA STAINING PATTERNS: Speckled Pattern: 1:640 {titer} — ABNORMAL HIGH

## 2024-10-29 LAB — SEDIMENTATION RATE: Sed Rate: 2 mm/h (ref 0–32)

## 2024-11-01 ENCOUNTER — Ambulatory Visit: Payer: Self-pay | Admitting: Allergy and Immunology

## 2024-11-03 ENCOUNTER — Encounter: Payer: Self-pay | Admitting: Allergy and Immunology

## 2024-11-03 LAB — SPECIMEN STATUS REPORT

## 2024-11-03 LAB — ANA W/REFLEX: Anti Nuclear Antibody (ANA): NEGATIVE
